# Patient Record
Sex: Male | Born: 1960 | Race: Black or African American | Hispanic: No | Marital: Married | Smoking: Former smoker
Health system: Southern US, Community
[De-identification: ages and names within clinical notes are randomized; demographics above are authoritative.]

---

## 2005-04-27 ENCOUNTER — Ambulatory Visit (HOSPITAL_COMMUNITY): Admission: RE | Admit: 2005-04-27 | Discharge: 2005-04-27 | Payer: Self-pay | Admitting: Family Medicine

## 2009-02-19 ENCOUNTER — Ambulatory Visit (HOSPITAL_COMMUNITY): Admission: RE | Admit: 2009-02-19 | Discharge: 2009-02-19 | Payer: Self-pay | Admitting: Family Medicine

## 2009-12-22 IMAGING — CR DG CHEST 2V
2 series · 2 of 2 positions shown · non-contrast
Comparison: Chest 04/27/2005.

CLINICAL DATA: Smoker.  Cough.

CHEST - 2 VIEW

[view not recorded (1 of 2)]
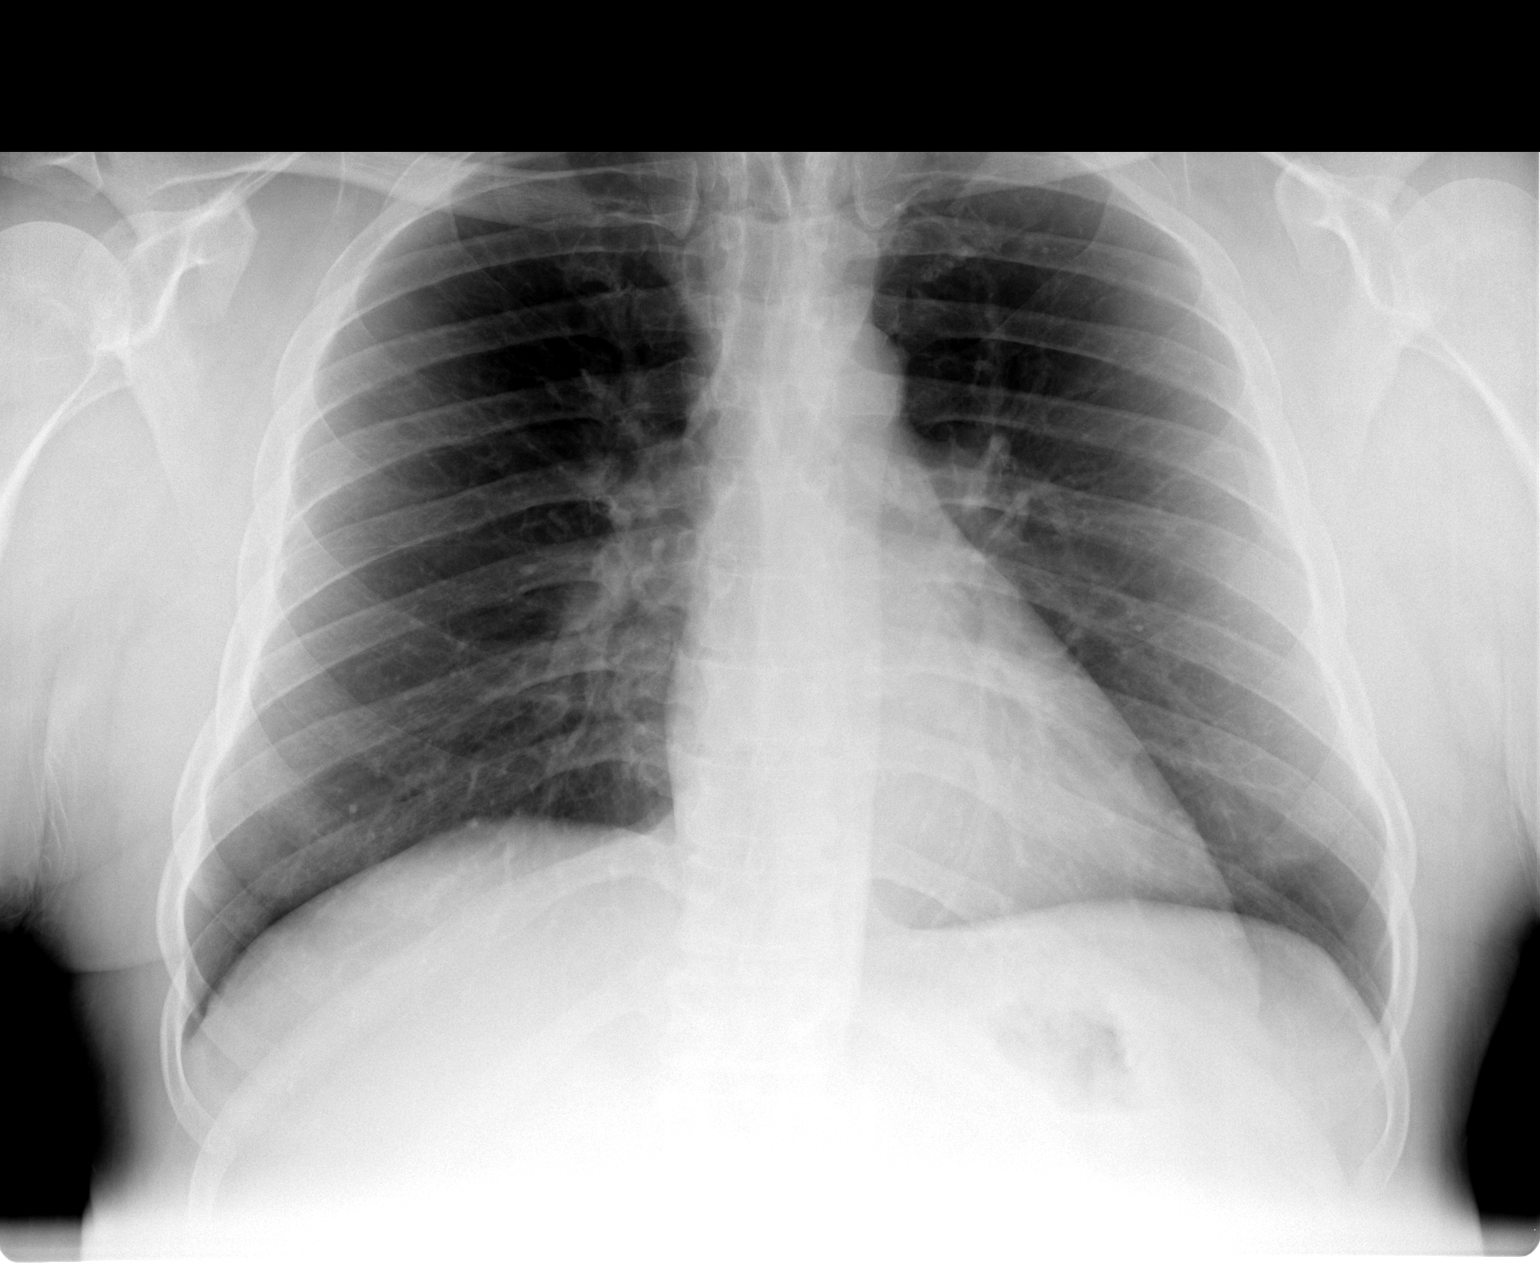

[view not recorded (2 of 2)]
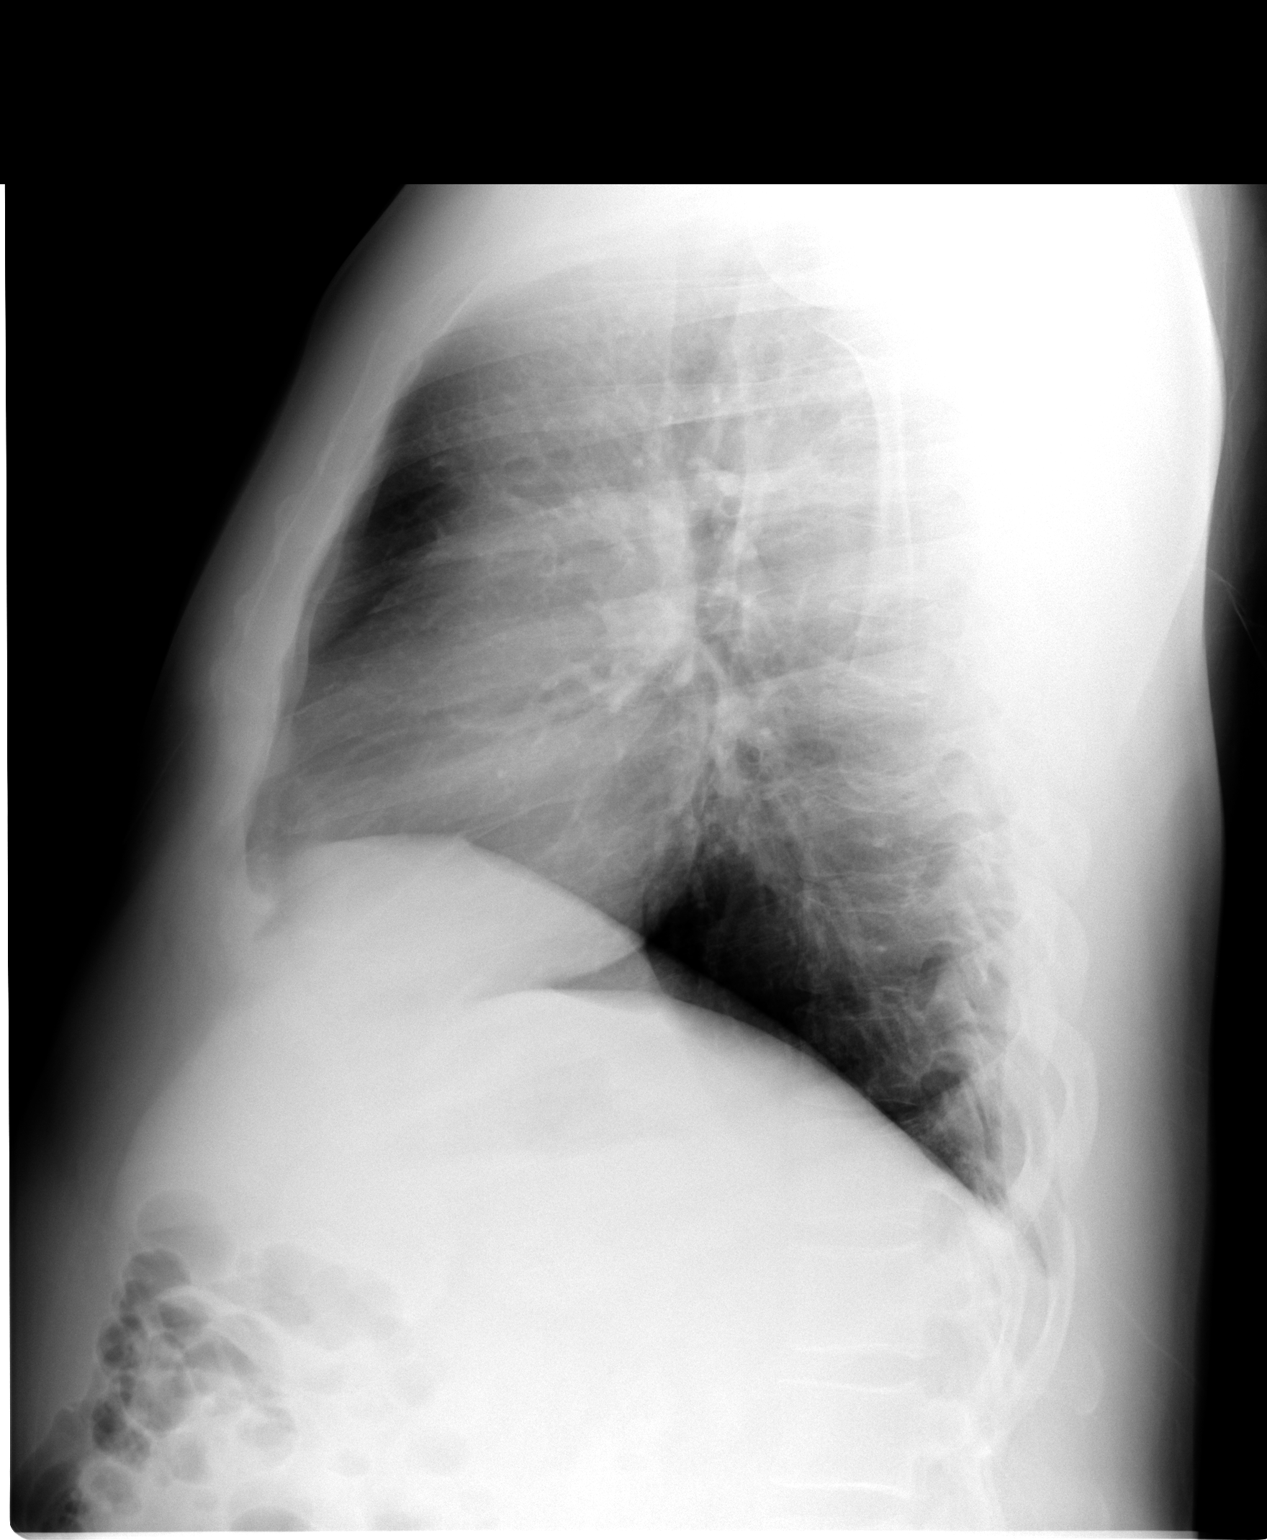

[2 of 2 positions shown; findings below may reference images not displayed]

FINDINGS: The lungs are clear.  Heart size is normal.  There is no
pleural effusion or focal bony abnormality.
IMPRESSION: No acute disease.

## 2019-11-22 ENCOUNTER — Ambulatory Visit: Payer: Self-pay | Attending: Internal Medicine

## 2019-11-22 DIAGNOSIS — Z23 Encounter for immunization: Secondary | ICD-10-CM

## 2019-11-22 NOTE — Progress Notes (Signed)
   Covid-19 Vaccination Clinic  Name:  Daniel Bautista    MRN: [id number] DOB: [birth year]  11/22/2019  [name] was observed post Covid-19 immunization for 15 minutes without incident. He was provided with Vaccine Information Sheet and instruction to access the V-Safe system.   [name] was instructed to call 911 with any severe reactions post vaccine: Marland Kitchen Difficulty breathing  . Swelling of face and throat  . A fast heartbeat  . A bad rash all over body  . Dizziness and weakness   Immunizations Administered    Name Date Dose VIS Date Route   Moderna COVID-19 Vaccine 11/22/2019  8:52 AM 0.5 mL 07/17/2019 Intramuscular   Manufacturer: Moderna   Lot: [id number]   NDC: 64847-207-21

## 2019-12-25 ENCOUNTER — Ambulatory Visit: Payer: Self-pay | Attending: Internal Medicine

## 2019-12-25 ENCOUNTER — Other Ambulatory Visit: Payer: Self-pay

## 2019-12-25 DIAGNOSIS — Z23 Encounter for immunization: Secondary | ICD-10-CM

## 2019-12-25 NOTE — Progress Notes (Signed)
   Covid-19 Vaccination Clinic  Name:  KALANI STHILAIRE    MRN: [id number] DOB: [birth year]  12/25/2019  Mr. Ericsson was observed post Covid-19 immunization for 15 minutes without incident. He was provided with Vaccine Information Sheet and instruction to access the V-Safe system.   Mr. Reuter was instructed to call 911 with any severe reactions post vaccine: Marland Kitchen Difficulty breathing  . Swelling of face and throat  . A fast heartbeat  . A bad rash all over body  . Dizziness and weakness   Immunizations Administered    Name Date Dose VIS Date Route   Moderna COVID-19 Vaccine 12/25/2019  9:19 AM 0.5 mL 07/2019 Intramuscular   Manufacturer: Moderna   Lot: [id number]   NDC: 72158-727-61

## 2019-12-26 ENCOUNTER — Ambulatory Visit: Payer: Self-pay

## 2021-01-23 DIAGNOSIS — R739 Hyperglycemia, unspecified: Secondary | ICD-10-CM | POA: Diagnosis not present

## 2021-01-23 DIAGNOSIS — L0291 Cutaneous abscess, unspecified: Secondary | ICD-10-CM | POA: Diagnosis not present

## 2021-01-23 DIAGNOSIS — R030 Elevated blood-pressure reading, without diagnosis of hypertension: Secondary | ICD-10-CM | POA: Diagnosis not present

## 2022-11-02 ENCOUNTER — Emergency Department (HOSPITAL_COMMUNITY): Payer: BC Managed Care – PPO

## 2022-11-02 ENCOUNTER — Other Ambulatory Visit: Payer: Self-pay

## 2022-11-02 ENCOUNTER — Inpatient Hospital Stay (HOSPITAL_COMMUNITY)
Admission: EM | Admit: 2022-11-02 | Discharge: 2022-11-05 | DRG: 321 | Disposition: A | Discharge status: Home / Self Care | Payer: BC Managed Care – PPO | Attending: Interventional Cardiology | Admitting: Interventional Cardiology

## 2022-11-02 ENCOUNTER — Inpatient Hospital Stay (HOSPITAL_COMMUNITY): Payer: BC Managed Care – PPO

## 2022-11-02 ENCOUNTER — Inpatient Hospital Stay (HOSPITAL_COMMUNITY)
Admission: EM | Disposition: A | Discharge status: Home / Self Care | Payer: Self-pay | Source: Home / Self Care | Attending: Interventional Cardiology

## 2022-11-02 DIAGNOSIS — R0789 Other chest pain: Secondary | ICD-10-CM | POA: Diagnosis not present

## 2022-11-02 DIAGNOSIS — Z8249 Family history of ischemic heart disease and other diseases of the circulatory system: Secondary | ICD-10-CM | POA: Diagnosis not present

## 2022-11-02 DIAGNOSIS — I25110 Atherosclerotic heart disease of native coronary artery with unstable angina pectoris: Secondary | ICD-10-CM | POA: Diagnosis not present

## 2022-11-02 DIAGNOSIS — I213 ST elevation (STEMI) myocardial infarction of unspecified site: Secondary | ICD-10-CM | POA: Diagnosis not present

## 2022-11-02 DIAGNOSIS — Z79899 Other long term (current) drug therapy: Secondary | ICD-10-CM | POA: Diagnosis not present

## 2022-11-02 DIAGNOSIS — I2111 ST elevation (STEMI) myocardial infarction involving right coronary artery: Principal | ICD-10-CM | POA: Diagnosis present

## 2022-11-02 DIAGNOSIS — I5021 Acute systolic (congestive) heart failure: Secondary | ICD-10-CM | POA: Diagnosis present

## 2022-11-02 DIAGNOSIS — Z955 Presence of coronary angioplasty implant and graft: Secondary | ICD-10-CM | POA: Diagnosis not present

## 2022-11-02 DIAGNOSIS — I2119 ST elevation (STEMI) myocardial infarction involving other coronary artery of inferior wall: Secondary | ICD-10-CM | POA: Diagnosis not present

## 2022-11-02 DIAGNOSIS — E118 Type 2 diabetes mellitus with unspecified complications: Secondary | ICD-10-CM | POA: Diagnosis not present

## 2022-11-02 DIAGNOSIS — F17200 Nicotine dependence, unspecified, uncomplicated: Secondary | ICD-10-CM | POA: Diagnosis present

## 2022-11-02 DIAGNOSIS — E871 Hypo-osmolality and hyponatremia: Secondary | ICD-10-CM | POA: Diagnosis present

## 2022-11-02 DIAGNOSIS — E785 Hyperlipidemia, unspecified: Secondary | ICD-10-CM | POA: Diagnosis not present

## 2022-11-02 DIAGNOSIS — F17210 Nicotine dependence, cigarettes, uncomplicated: Secondary | ICD-10-CM | POA: Diagnosis present

## 2022-11-02 DIAGNOSIS — I2109 ST elevation (STEMI) myocardial infarction involving other coronary artery of anterior wall: Secondary | ICD-10-CM | POA: Diagnosis present

## 2022-11-02 DIAGNOSIS — I255 Ischemic cardiomyopathy: Secondary | ICD-10-CM | POA: Diagnosis not present

## 2022-11-02 DIAGNOSIS — D72829 Elevated white blood cell count, unspecified: Secondary | ICD-10-CM | POA: Diagnosis not present

## 2022-11-02 DIAGNOSIS — E1169 Type 2 diabetes mellitus with other specified complication: Secondary | ICD-10-CM | POA: Diagnosis not present

## 2022-11-02 DIAGNOSIS — I2584 Coronary atherosclerosis due to calcified coronary lesion: Secondary | ICD-10-CM | POA: Diagnosis present

## 2022-11-02 DIAGNOSIS — I2510 Atherosclerotic heart disease of native coronary artery without angina pectoris: Secondary | ICD-10-CM

## 2022-11-02 DIAGNOSIS — E1165 Type 2 diabetes mellitus with hyperglycemia: Secondary | ICD-10-CM | POA: Diagnosis not present

## 2022-11-02 DIAGNOSIS — R079 Chest pain, unspecified: Secondary | ICD-10-CM | POA: Diagnosis not present

## 2022-11-02 HISTORY — DX: ST elevation (STEMI) myocardial infarction involving other coronary artery of inferior wall: I21.19

## 2022-11-02 HISTORY — PX: LEFT HEART CATH AND CORONARY ANGIOGRAPHY: CATH118249

## 2022-11-02 HISTORY — PX: CORONARY/GRAFT ACUTE MI REVASCULARIZATION: CATH118305

## 2022-11-02 LAB — CREATININE, SERUM
Creatinine, Ser: 1.08 mg/dL (ref 0.61–1.24)
GFR, Estimated: >60 mL/min (ref >60)

## 2022-11-02 LAB — LIPID PANEL
Cholesterol: 251 mg/dL — ABNORMAL HIGH (ref 0–200)
HDL: 41 mg/dL (ref >40)
LDL Cholesterol: 169 mg/dL — ABNORMAL HIGH (ref 0–99)
Total CHOL/HDL Ratio: 6.1 RATIO
Triglycerides: 205 mg/dL — ABNORMAL HIGH (ref <150)
VLDL: 41 mg/dL — ABNORMAL HIGH (ref 0–40)

## 2022-11-02 LAB — CBC WITH DIFFERENTIAL/PLATELET
Abs Immature Granulocytes: 0.07 10*3/uL (ref 0.00–0.07)
Basophils Absolute: 0.1 10*3/uL (ref 0.0–0.1)
Basophils Relative: 1 %
Eosinophils Absolute: 0.2 10*3/uL (ref 0.0–0.5)
Eosinophils Relative: 2 %
HCT: 50.3 % (ref 39.0–52.0)
Hemoglobin: 17.1 g/dL — ABNORMAL HIGH (ref 13.0–17.0)
Immature Granulocytes: 1 %
Lymphocytes Relative: 33 %
Lymphs Abs: 3.9 10*3/uL (ref 0.7–4.0)
MCH: 28 pg (ref 26.0–34.0)
MCHC: 34 g/dL (ref 30.0–36.0)
MCV: 82.5 fL (ref 80.0–100.0)
Monocytes Absolute: 1 10*3/uL (ref 0.1–1.0)
Monocytes Relative: 9 %
Neutro Abs: 6.5 10*3/uL (ref 1.7–7.7)
Neutrophils Relative %: 54 %
Platelets: 279 10*3/uL (ref 150–400)
RBC: 6.1 MIL/uL — ABNORMAL HIGH (ref 4.22–5.81)
RDW: 12.5 % (ref 11.5–15.5)
WBC: 11.8 10*3/uL — ABNORMAL HIGH (ref 4.0–10.5)
nRBC: 0 % (ref 0.0–0.2)

## 2022-11-02 LAB — CBC
HCT: 49.3 % (ref 39.0–52.0)
Hemoglobin: 17 g/dL (ref 13.0–17.0)
MCH: 28 pg (ref 26.0–34.0)
MCHC: 34.5 g/dL (ref 30.0–36.0)
MCV: 81.1 fL (ref 80.0–100.0)
Platelets: 266 10*3/uL (ref 150–400)
RBC: 6.08 MIL/uL — ABNORMAL HIGH (ref 4.22–5.81)
RDW: 12.4 % (ref 11.5–15.5)
WBC: 15.1 10*3/uL — ABNORMAL HIGH (ref 4.0–10.5)
nRBC: 0 % (ref 0.0–0.2)

## 2022-11-02 LAB — TSH: TSH: 0.729 u[IU]/mL (ref 0.350–4.500)

## 2022-11-02 LAB — COMPREHENSIVE METABOLIC PANEL
ALT: 26 U/L (ref 0–44)
AST: 22 U/L (ref 15–41)
Albumin: 4.1 g/dL (ref 3.5–5.0)
Alkaline Phosphatase: 77 U/L (ref 38–126)
Anion gap: 11 (ref 5–15)
BUN: 14 mg/dL (ref 8–23)
CO2: 24 mmol/L (ref 22–32)
Calcium: 9.2 mg/dL (ref 8.9–10.3)
Chloride: 96 mmol/L — ABNORMAL LOW (ref 98–111)
Creatinine, Ser: 1.14 mg/dL (ref 0.61–1.24)
GFR, Estimated: >60 mL/min (ref >60)
Glucose, Bld: 339 mg/dL — ABNORMAL HIGH (ref 70–99)
Potassium: 3.8 mmol/L (ref 3.5–5.1)
Sodium: 131 mmol/L — ABNORMAL LOW (ref 135–145)
Total Bilirubin: 0.8 mg/dL (ref 0.3–1.2)
Total Protein: 7.6 g/dL (ref 6.5–8.1)

## 2022-11-02 LAB — MAGNESIUM: Magnesium: 2.2 mg/dL (ref 1.7–2.4)

## 2022-11-02 LAB — POCT ACTIVATED CLOTTING TIME
Activated Clotting Time: 228 seconds
Activated Clotting Time: 428 seconds

## 2022-11-02 LAB — PROTIME-INR
INR: 1 (ref 0.8–1.2)
Prothrombin Time: 13.1 seconds (ref 11.4–15.2)

## 2022-11-02 LAB — APTT: aPTT: 22 seconds — ABNORMAL LOW (ref 24–36)

## 2022-11-02 LAB — TROPONIN I (HIGH SENSITIVITY)
Troponin I (High Sensitivity): 4 ng/L (ref <18)
Troponin I (High Sensitivity): 7810 ng/L (ref <18)

## 2022-11-02 LAB — HIV ANTIBODY (ROUTINE TESTING W REFLEX): HIV Screen 4th Generation wRfx: NONREACTIVE

## 2022-11-02 SURGERY — LEFT HEART CATH AND CORONARY ANGIOGRAPHY
Anesthesia: LOCAL

## 2022-11-02 MED ORDER — MIDAZOLAM HCL 2 MG/2ML IJ SOLN
INTRAMUSCULAR | Status: DC | PRN
  Administered 2022-11-02: 1 mg via INTRAVENOUS

## 2022-11-02 MED ORDER — TIROFIBAN HCL IN NACL 5-0.9 MG/100ML-% IV SOLN
0.1500 ug/kg/min | INTRAVENOUS | Status: AC
  Administered 2022-11-02: 0.15 ug/kg/min via INTRAVENOUS
  Filled 2022-11-02: qty 100

## 2022-11-02 MED ORDER — HEPARIN (PORCINE) IN NACL 1000-0.9 UT/500ML-% IV SOLN
INTRAVENOUS | Status: DC | PRN
  Administered 2022-11-02 (×2): 500 mL

## 2022-11-02 MED ORDER — SODIUM CHLORIDE 0.9 % IV SOLN: INTRAVENOUS | Status: DC

## 2022-11-02 MED ORDER — SODIUM CHLORIDE 0.9 % IV SOLN: INTRAVENOUS | Status: AC

## 2022-11-02 MED ORDER — HEPARIN SODIUM (PORCINE) 5000 UNIT/ML IJ SOLN
4000.0000 [IU] | Freq: Once | INTRAMUSCULAR | Status: AC
  Administered 2022-11-02: 4000 [IU] via INTRAVENOUS
  Filled 2022-11-02: qty 1

## 2022-11-02 MED ORDER — TIROFIBAN (AGGRASTAT) BOLUS VIA INFUSION
INTRAVENOUS | Status: DC | PRN
  Administered 2022-11-02: 1950 ug via INTRAVENOUS

## 2022-11-02 MED ORDER — HEPARIN SODIUM (PORCINE) 5000 UNIT/ML IJ SOLN: 5000.0000 [IU] | Freq: Three times a day (TID) | INTRAMUSCULAR | Status: DC

## 2022-11-02 MED ORDER — SODIUM CHLORIDE 0.9 % IV SOLN: 250.0000 mL | INTRAVENOUS | Status: DC | PRN

## 2022-11-02 MED ORDER — SODIUM CHLORIDE 0.9% FLUSH: 3.0000 mL | INTRAVENOUS | Status: DC | PRN

## 2022-11-02 MED ORDER — ATORVASTATIN CALCIUM 80 MG PO TABS
80.0000 mg | ORAL_TABLET | Freq: Every day | ORAL | Status: DC
  Administered 2022-11-02 – 2022-11-05 (×4): 80 mg via ORAL
  Filled 2022-11-02 (×4): qty 1

## 2022-11-02 MED ORDER — FENTANYL CITRATE (PF) 100 MCG/2ML IJ SOLN
INTRAMUSCULAR | Status: DC | PRN
  Administered 2022-11-02: 50 ug via INTRAVENOUS
  Administered 2022-11-02: 25 ug via INTRAVENOUS

## 2022-11-02 MED ORDER — MORPHINE SULFATE (PF) 2 MG/ML IV SOLN: 2.0000 mg | INTRAVENOUS | Status: DC | PRN

## 2022-11-02 MED ORDER — FENTANYL CITRATE PF 50 MCG/ML IJ SOSY
50.0000 ug | PREFILLED_SYRINGE | Freq: Once | INTRAMUSCULAR | Status: AC
  Administered 2022-11-02: 50 ug via INTRAVENOUS
  Filled 2022-11-02: qty 1

## 2022-11-02 MED ORDER — TICAGRELOR 90 MG PO TABS
ORAL_TABLET | ORAL | Status: AC
  Filled 2022-11-02: qty 1

## 2022-11-02 MED ORDER — NITROGLYCERIN 1 MG/10 ML FOR IR/CATH LAB
INTRA_ARTERIAL | Status: DC | PRN
  Administered 2022-11-02: 200 ug via INTRACORONARY

## 2022-11-02 MED ORDER — TIROFIBAN HCL IN NACL 5-0.9 MG/100ML-% IV SOLN
INTRAVENOUS | Status: AC | PRN
  Administered 2022-11-02: .15 ug/kg/min via INTRAVENOUS

## 2022-11-02 MED ORDER — ASPIRIN 81 MG PO CHEW
324.0000 mg | CHEWABLE_TABLET | Freq: Once | ORAL | Status: AC
  Administered 2022-11-02: 324 mg via ORAL
  Filled 2022-11-02: qty 4

## 2022-11-02 MED ORDER — MIDAZOLAM HCL 2 MG/2ML IJ SOLN
INTRAMUSCULAR | Status: AC
  Filled 2022-11-02: qty 2

## 2022-11-02 MED ORDER — VERAPAMIL HCL 2.5 MG/ML IV SOLN
INTRAVENOUS | Status: AC
  Filled 2022-11-02: qty 2

## 2022-11-02 MED ORDER — HEPARIN SODIUM (PORCINE) 1000 UNIT/ML IJ SOLN
INTRAMUSCULAR | Status: AC
  Filled 2022-11-02: qty 10

## 2022-11-02 MED ORDER — HEPARIN SODIUM (PORCINE) 1000 UNIT/ML IJ SOLN
INTRAMUSCULAR | Status: DC | PRN
  Administered 2022-11-02: 4000 [IU] via INTRAVENOUS
  Administered 2022-11-02: 6500 [IU] via INTRAVENOUS

## 2022-11-02 MED ORDER — ONDANSETRON HCL 4 MG/2ML IJ SOLN: 4.0000 mg | Freq: Four times a day (QID) | INTRAMUSCULAR | Status: DC | PRN

## 2022-11-02 MED ORDER — HEPARIN SODIUM (PORCINE) 5000 UNIT/ML IJ SOLN
5000.0000 [IU] | Freq: Three times a day (TID) | INTRAMUSCULAR | Status: DC
  Administered 2022-11-02 – 2022-11-05 (×8): 5000 [IU] via SUBCUTANEOUS
  Filled 2022-11-02 (×8): qty 1

## 2022-11-02 MED ORDER — LIDOCAINE HCL (PF) 1 % IJ SOLN
INTRAMUSCULAR | Status: DC | PRN
  Administered 2022-11-02: 2 mL

## 2022-11-02 MED ORDER — ASPIRIN 81 MG PO CHEW
CHEWABLE_TABLET | ORAL | Status: AC
  Administered 2022-11-02: 324 mg via ORAL
  Filled 2022-11-02: qty 3

## 2022-11-02 MED ORDER — TIROFIBAN HCL IN NACL 5-0.9 MG/100ML-% IV SOLN
INTRAVENOUS | Status: AC
  Filled 2022-11-02: qty 100

## 2022-11-02 MED ORDER — FENTANYL CITRATE (PF) 100 MCG/2ML IJ SOLN
INTRAMUSCULAR | Status: AC
  Filled 2022-11-02: qty 2

## 2022-11-02 MED ORDER — HYDRALAZINE HCL 20 MG/ML IJ SOLN: 10.0000 mg | INTRAMUSCULAR | Status: AC | PRN

## 2022-11-02 MED ORDER — METOPROLOL TARTRATE 25 MG PO TABS
25.0000 mg | ORAL_TABLET | Freq: Two times a day (BID) | ORAL | Status: DC
  Administered 2022-11-02 – 2022-11-05 (×6): 25 mg via ORAL
  Filled 2022-11-02 (×6): qty 1

## 2022-11-02 MED ORDER — TICAGRELOR 90 MG PO TABS
90.0000 mg | ORAL_TABLET | Freq: Two times a day (BID) | ORAL | Status: DC
  Administered 2022-11-03 – 2022-11-05 (×5): 90 mg via ORAL
  Filled 2022-11-02 (×5): qty 1

## 2022-11-02 MED ORDER — NITROGLYCERIN 0.4 MG SL SUBL: 0.4000 mg | SUBLINGUAL_TABLET | SUBLINGUAL | Status: DC | PRN

## 2022-11-02 MED ORDER — LIDOCAINE HCL (PF) 1 % IJ SOLN
INTRAMUSCULAR | Status: AC
  Filled 2022-11-02: qty 30

## 2022-11-02 MED ORDER — VERAPAMIL HCL 2.5 MG/ML IV SOLN
INTRAVENOUS | Status: DC | PRN
  Administered 2022-11-02: 10 mL via INTRA_ARTERIAL

## 2022-11-02 MED ORDER — ASPIRIN 81 MG PO TBEC
81.0000 mg | DELAYED_RELEASE_TABLET | Freq: Every day | ORAL | Status: DC
  Administered 2022-11-03 – 2022-11-05 (×3): 81 mg via ORAL
  Filled 2022-11-02 (×3): qty 1

## 2022-11-02 MED ORDER — TICAGRELOR 90 MG PO TABS
ORAL_TABLET | ORAL | Status: DC | PRN
  Administered 2022-11-02: 180 mg via ORAL

## 2022-11-02 MED ORDER — LABETALOL HCL 5 MG/ML IV SOLN: 10.0000 mg | INTRAVENOUS | Status: AC | PRN

## 2022-11-02 MED ORDER — IOHEXOL 350 MG/ML SOLN
INTRAVENOUS | Status: DC | PRN
  Administered 2022-11-02: 160 mL

## 2022-11-02 MED ORDER — ACETAMINOPHEN 325 MG PO TABS: 650.0000 mg | ORAL_TABLET | ORAL | Status: DC | PRN

## 2022-11-02 MED ORDER — SODIUM CHLORIDE 0.9% FLUSH
3.0000 mL | Freq: Two times a day (BID) | INTRAVENOUS | Status: DC
  Administered 2022-11-02 – 2022-11-05 (×4): 3 mL via INTRAVENOUS

## 2022-11-02 SURGICAL SUPPLY — 27 items
BALL SAPPHIRE NC24 3.0X10 (BALLOONS) ×1
BALLN EMERGE MR 2.5X12 (BALLOONS) ×1
BALLN EMERGE MR 2.5X20 (BALLOONS) ×1
BALLN ~~LOC~~ EMERGE MR 2.75X30 (BALLOONS) ×1
BALLOON EMERGE MR 2.5X12 (BALLOONS) IMPLANT
BALLOON EMERGE MR 2.5X20 (BALLOONS) IMPLANT
BALLOON SAPPHIRE NC24 3.0X10 (BALLOONS) IMPLANT
BALLOON ~~LOC~~ EMERGE MR 2.75X30 (BALLOONS) IMPLANT
CATH OPTITORQUE TIG 4.0 5F (CATHETERS) IMPLANT
CATH VISTA GUIDE 6FR JR4 (CATHETERS) IMPLANT
DEVICE RAD COMP TR BAND LRG (VASCULAR PRODUCTS) IMPLANT
ELECT DEFIB PAD ADLT CADENCE (PAD) IMPLANT
GLIDESHEATH SLEND SS 6F .021 (SHEATH) IMPLANT
GUIDEWIRE INQWIRE 1.5J.035X260 (WIRE) IMPLANT
INQWIRE 1.5J .035X260CM (WIRE) ×1
KIT ENCORE 26 ADVANTAGE (KITS) IMPLANT
KIT HEART LEFT (KITS) ×2 IMPLANT
PACK CARDIAC CATHETERIZATION (CUSTOM PROCEDURE TRAY) ×2 IMPLANT
SHEATH PROBE COVER 6X72 (BAG) IMPLANT
STENT SYNERGY XD 2.50X48 (Permanent Stent) IMPLANT
STENT SYNERGY XD 2.75X12 (Permanent Stent) IMPLANT
SYNERGY XD 2.50X48 (Permanent Stent) ×1 IMPLANT
SYNERGY XD 2.75X12 (Permanent Stent) ×1 IMPLANT
TRANSDUCER W/STOPCOCK (MISCELLANEOUS) ×2 IMPLANT
TUBING CIL FLEX 10 FLL-RA (TUBING) ×2 IMPLANT
WIRE ASAHI PROWATER 180CM (WIRE) IMPLANT
WIRE RUNTHROUGH IZANAI 014 180 (WIRE) IMPLANT

## 2022-11-02 NOTE — Progress Notes (Signed)
MEDICATION RELATED CONSULT NOTE - INITIAL   Pharmacy Consult for Tirofiban Indication: s/p PCI  Not on File  Patient Measurements: Height: 5\' 8"  (172.7 cm) Weight: 78 kg (172 lb) IBW/kg (Calculated) : 68.4  Vital Signs: Temp: 98.2 F (36.8 C) (03/19 1834) Temp Source: Oral (03/19 1834) BP: 124/73 (03/19 1834) Pulse Rate: 0 (03/19 1824) Intake/Output from previous day: No intake/output data recorded. Intake/Output from this shift: No intake/output data recorded.  Labs: Recent Labs    11/02/22 1552  WBC 11.8*  HGB 17.1*  HCT 50.3  PLT 279  APTT 22*  CREATININE 1.14  MG 2.2  ALBUMIN 4.1  PROT 7.6  AST 22  ALT 26  ALKPHOS 77  BILITOT 0.8   Estimated Creatinine Clearance: 65.8 mL/min (by C-G formula based on SCr of 1.14 mg/dL).   Assessment: 62 y.o. presented as code STEMI - now s/p PCI. To continue tirofiban x 6 hours.  Plan:  Tirofiban 0.15 mcg/kg/min x 6 hours  Sherlon Handing, PharmD, BCPS Please see amion for complete clinical pharmacist phone list 11/02/2022,7:05 PM

## 2022-11-02 NOTE — ED Triage Notes (Signed)
This RN, FT RN, and triage NT unable to obtain oral or axillary temp with multiple thermometers.

## 2022-11-02 NOTE — ED Triage Notes (Signed)
Pt ambulatory in triage. NAD noted. Respirations even and unlabored. EKG in process.

## 2022-11-02 NOTE — H&P (Addendum)
Cardiology Admission History and Physical   Patient ID: Daniel Bautista MRN: [id number]; DOB: [birth year]   Admission date: 11/02/2022  PCP:  Merryl Hacker, No   [facility] Cardiologist: New to Dr Ellyn Hack    Chief Complaint:  chest pain   Patient Profile:   Daniel Bautista is a 62 y.o. male with no known PMH presented to [facility] on  11/02/2022 for chest pain.  Transferred to [facility] for emergent cardiac catheterization due to anterior inferior STEMI.  History of Present Illness:   [name] presented to [facility] emergency room at roughly 3:40 PM with complaints of chest pain. He has no known medical history. He states he just finished painting the walls. He started having mid-sternum chest pain around 245pm. He was diaphoretic per observation of his wife. He denied nausea, vomiting, SOB, orthopnea, leg edema.  He had 8/10 pain upon arrival to ER, received ASA, heparin bolus, as well as fentanyl. He has overall improved pain since. He smokes 1/2 PPD, seldom use of ETOH. He reports his father had heart attack in his 53s. He denied hx of HTN, DM, CVA, CKD. He denied any known allergy. He denied any hx of major bleeding or recent bleeding.   EKG in the field showed sinus rhythm 67 bpm, acute ST elevations in anterior as well as inferior leads. Patient is transferred to [facility] for emergent cardiac cath now.  Additional diagnostic lab at ER revealed hyponatremia 131, glucose 339, creatinine 1.14, GFR >60.  High sensitive troponin 4.  LDL 169, triglyceride 205, HDL 41.  CBC was leukocytosis 11 800, hemoglobin elevated 17.1.   Medications Prior to Admission: Prior to Admission medications   Not on File     Allergies:   Not on File  Social History:   Social History   Socioeconomic History   Marital status: Divorced    Spouse name: Not on file   Number of children: Not on file   Years of education: Not on file   Highest education level: Not on file  Occupational  History   Not on file  Tobacco Use   Smoking status: Not on file   Smokeless tobacco: Not on file  Substance and Sexual Activity   Alcohol use: Not on file   Drug use: Not on file   Sexual activity: Not on file  Other Topics Concern   Not on file  Social History Narrative   Not on file   Social Determinants of Health   Financial Resource Strain: Not on file  Food Insecurity: Not on file  Transportation Needs: Not on file  Physical Activity: Not on file  Stress: Not on file  Social Connections: Not on file  Intimate Partner Violence: Not on file    Family History:    Father: MI at 55s   ROS:  Constitutional: Denied fever, chills Eyes: Denied vision loss Ears/Nose/Mouth/Throat: Denied ear ache, sore throat, sinus pain Cardiovascular: see HPI  Respiratory: denied shortness of breath Gastrointestinal: Denied nausea, vomiting, abdominal pain, diarrhea Genital/Urinary: Denied dysuria, hematuria, urinary frequency/urgency Musculoskeletal: Denied muscle ache, joint pain, weakness Skin: Denied rash, wound Neuro: Denied headache, dizziness, syncope Psych: Denied history of depression/anxiety  Endocrine: Denied history of diabetes   Physical Exam/Data:   Vitals:   11/02/22 1529 11/02/22 1607 11/02/22 1613 11/02/22 1615  BP: 105/80  (!) 134/98 (!) 128/97  Pulse: 64 64 81 71  Resp: (!) 21 11 15 12   SpO2: 100% 100% 100% 100%  Weight:  Height:       No intake or output data in the 24 hours ending 11/02/22 1711    11/02/2022    3:27 PM  Last 3 Weights  Weight (lbs) 172 lb  Weight (kg) 78.019 kg     Body mass index is 26.15 kg/m.   Vitals:  Vitals:   11/02/22 1613 11/02/22 1615  BP: (!) 134/98 (!) 128/97  Pulse: 81 71  Resp: 15 12  SpO2: 100% 100%   General Appearance: In no apparent distress, sitting in bed HEENT: Normocephalic, atraumatic. Wears eyeglasses Neck: Supple, trachea midline, no JVDs Cardiovascular: Regular rate and rhythm, normal S1-S2,  no  murmur Respiratory: Resting breathing unlabored, lungs sounds clear to auscultation bilaterally, no use of accessory muscles. On [place] oxygen. No wheezes, rales or rhonchi.   Gastrointestinal: Bowel sounds positive, abdomen soft, non-tender Extremities: Able to move all extremities in bed without difficulty, no edema of BLE Musculoskeletal: Normal muscle bulk and tone Skin: Intact, warm, dry. No rashes or petechiae noted in exposed areas.  Neurologic: Alert, oriented to person, place and time. Fluent speech, no cognitive deficit,  no gross focal neuro deficit Psychiatric: Mild anxiety     EKG:  The ECG that was done showed sinus rhythm, acute ST elevations in anterior as well as inferior leads. EKG showed 6+ millimeters ST elevation in V2, V3 and V4 as well as 3 to 4 mm ST elevations in inferior leads.   Relevant CV Studies:  N/A   Laboratory Data:  High Sensitivity Troponin:   Recent Labs  Lab 11/02/22 1552  TROPONINIHS 4      Chemistry Recent Labs  Lab 11/02/22 1552  NA 131*  K 3.8  CL 96*  CO2 24  GLUCOSE 339*  BUN 14  CREATININE 1.14  CALCIUM 9.2  MG 2.2  GFRNONAA >60  ANIONGAP 11    Recent Labs  Lab 11/02/22 1552  PROT 7.6  ALBUMIN 4.1  AST 22  ALT 26  ALKPHOS 77  BILITOT 0.8   Lipids  Recent Labs  Lab 11/02/22 1552  CHOL 251*  TRIG 205*  HDL 41  LDLCALC 169*  CHOLHDL 6.1   Hematology Recent Labs  Lab 11/02/22 1552  WBC 11.8*  RBC 6.10*  HGB 17.1*  HCT 50.3  MCV 82.5  MCH 28.0  MCHC 34.0  RDW 12.5  PLT 279   Thyroid No results for input(s): "TSH", "FREET4" in the last 168 hours. BNPNo results for input(s): "BNP", "PROBNP" in the last 168 hours.  DDimer No results for input(s): "DDIMER" in the last 168 hours.   Radiology/Studies:  No results found.   Assessment and Plan:   Acute Anterior-Inferior STEMI -Presented with chest pain started at 2:45 PM after painting walls at home - EKG with anterior inferior ST elevation - Hs trop  4 x1 -Transferred to [facility] for emergent cardiac catheterization -Will admit ICU,  check A1C, TSH, obtain Echo, further post-cath order to follow Beta blocker & high dose statin  Hyperglycemia -Will check A1c  Hyponatremia -Sodium 131, will trend BMP  Hyperlipidemia -Nonfasting lipid with LDL 169, triglyceride 205, HDL 41 -Medical therapy pending cath report  Leukocytosis -WBC 11800, trend CBC,  monitor fever, no signs of active infection at current time     Shared Decision Making/Informed Consent  The risks [stroke (1 in 1000), death (1 in 1000), kidney failure [usually temporary] (1 in 500), bleeding (1 in 200), allergic reaction [possibly serious] (1 in 200)], benefits (diagnostic support  and management of coronary artery disease) and alternatives of a cardiac catheterization were discussed in detail with Mr. Moya and he is willing to proceed.    Risk Assessment/Risk Scores:  }  TIMI Risk Score for ST  Elevation MI:   The patient's TIMI risk score is 2, which indicates a 2.2% risk of all cause mortality at 30 days.{    Severity of Illness: The appropriate patient status for this patient is INPATIENT. Inpatient status is judged to be reasonable and necessary in order to provide the required intensity of service to ensure the patient's safety. The patient's presenting symptoms, physical exam findings, and initial radiographic and laboratory data in the context of their chronic comorbidities is felt to place them at high risk for further clinical deterioration. Furthermore, it is not anticipated that the patient will be medically stable for discharge from the hospital within 2 midnights of admission.   * I certify that at the point of admission it is my clinical judgment that the patient will require inpatient hospital care spanning beyond 2 midnights from the point of admission due to high intensity of service, high risk for further deterioration and high frequency of  surveillance required.*   For questions or updates, please contact [place] Please consult [email or web address] for contact info under     Signed, Margie Billet, NP  11/02/2022 5:11 PM    ATTENDING ATTESTATION  I have seen, examined and evaluated the patient this PM in the Cath lab along with Margie Billet, NP.  After reviewing all the available data and chart, we discussed the patients laboratory, study & physical findings as well as symptoms in detail.  I agree with her findings, examination as well as impression recommendations as per our discussion.    Attending adjustments noted in italics.   Recent EKG, difficult televisit anterior or inferior MI.  Still having 6/10 chest pain upon arrival to Cath Lab.  Hemodynamically stable.  Seems comfortable.  Further plans post PCI    Leonie Man, MD, MS Glenetta Hew, M.D., M.S. Interventional Cardiologist  [facility]  Pager # [phone] Phone # [phone] [address] [place], [place] [address]

## 2022-11-02 NOTE — ED Provider Notes (Signed)
[facility] Provider Note   CSN: [id number] Arrival date & time: 11/02/22  1519     History  Chief Complaint  Patient presents with   Chest Pain    Daniel Bautista is a 62 y.o. male.   Chest Pain Associated symptoms: diaphoresis   Patient presents for chest pain.  He has no known medical problems.  He does smoke.  His father had a heart attack in his 80s.  Patient was in his normal state of health earlier today.  Approximate hour prior to arrival, he developed substernal chest pain.  His wife noticed that he was diaphoretic.  Patient denies any radiations of pain.  He denies any associated nausea.  Current pain is 8/10 in severity.  He does not take any daily medications.     Home Medications Prior to Admission medications   Not on File      Allergies    Patient has no allergy information on record.    Review of Systems   Review of Systems  Constitutional:  Positive for diaphoresis.  Cardiovascular:  Positive for chest pain.  All other systems reviewed and are negative.   Physical Exam Updated Vital Signs BP (!) 124/101   Pulse (!) 0   Resp (!) 21   Ht 5\' 8"  (1.727 m)   Wt 78 kg   SpO2 94%   BMI 26.15 kg/m  Physical Exam Vitals and nursing note reviewed.  Constitutional:      General: He is not in acute distress.    Appearance: He is well-developed. He is not ill-appearing, toxic-appearing or diaphoretic.  HENT:     Head: Normocephalic and atraumatic.  Eyes:     Conjunctiva/sclera: Conjunctivae normal.  Cardiovascular:     Rate and Rhythm: Normal rate and regular rhythm.     Heart sounds: No murmur heard. Pulmonary:     Effort: Pulmonary effort is normal. No respiratory distress.     Breath sounds: Normal breath sounds. No decreased breath sounds, wheezing, rhonchi or rales.  Chest:     Chest wall: No tenderness.  Abdominal:     Palpations: Abdomen is soft.     Tenderness: There is no abdominal  tenderness.  Musculoskeletal:        General: No swelling. Normal range of motion.     Cervical back: Neck supple.  Skin:    General: Skin is warm and dry.     Capillary Refill: Capillary refill takes less than 2 seconds.     Coloration: Skin is not cyanotic or pale.  Neurological:     General: No focal deficit present.     Mental Status: He is alert and oriented to person, place, and time.  Psychiatric:        Mood and Affect: Mood normal.        Behavior: Behavior normal.     ED Results / Procedures / Treatments   Labs (all labs ordered are listed, but only abnormal results are displayed) Labs Reviewed  CBC WITH DIFFERENTIAL/PLATELET - Abnormal; Notable for the following components:      Result Value   WBC 11.8 (*)    RBC 6.10 (*)    Hemoglobin 17.1 (*)    All other components within normal limits  APTT - Abnormal; Notable for the following components:   aPTT 22 (*)    All other components within normal limits  COMPREHENSIVE METABOLIC PANEL - Abnormal; Notable for the following components:  Sodium 131 (*)    Chloride 96 (*)    Glucose, Bld 339 (*)    All other components within normal limits  LIPID PANEL - Abnormal; Notable for the following components:   Cholesterol 251 (*)    Triglycerides 205 (*)    VLDL 41 (*)    LDL Cholesterol 169 (*)    All other components within normal limits  PROTIME-INR  MAGNESIUM  HEMOGLOBIN A1C  HIV ANTIBODY (ROUTINE TESTING W REFLEX)  LIPOPROTEIN A (LPA)  CBC  CREATININE, SERUM  TSH  CBC WITH DIFFERENTIAL/PLATELET  BASIC METABOLIC PANEL  POCT ACTIVATED CLOTTING TIME  POCT ACTIVATED CLOTTING TIME  TROPONIN I (HIGH SENSITIVITY)  TROPONIN I (HIGH SENSITIVITY)    EKG None  Radiology No results found.  Procedures Procedures    Medications Ordered in ED Medications  0.9 %  sodium chloride infusion ( Intravenous New Bag/Given 11/02/22 1615)  aspirin EC tablet 81 mg (has no administration in time range)  nitroGLYCERIN  (NITROSTAT) SL tablet 0.4 mg (has no administration in time range)  acetaminophen (TYLENOL) tablet 650 mg (has no administration in time range)  ondansetron (ZOFRAN) injection 4 mg (has no administration in time range)  heparin injection 5,000 Units (has no administration in time range)  ticagrelor (BRILINTA) tablet 90 mg (has no administration in time range)  aspirin chewable tablet 324 mg (324 mg Oral Given 11/02/22 1617)  heparin injection 4,000 Units (4,000 Units Intravenous Given 11/02/22 1614)  fentaNYL (SUBLIMAZE) injection 50 mcg (50 mcg Intravenous Given 11/02/22 1608)  tirofiban (AGGRASTAT) infusion 50 mcg/mL 100 mL (0.15 mcg/kg/min  78 kg Intravenous New Bag/Given 11/02/22 1728)    ED Course/ Medical Decision Making/ A&P                             Medical Decision Making Amount and/or Complexity of Data Reviewed Labs: ordered. Radiology: ordered.  Risk OTC drugs. Prescription drug management.   This patient presents to the ED for concern of chest pain, this involves an extensive number of treatment options, and is a complaint that carries with it a high risk of complications and morbidity.  The differential diagnosis includes ACS, PE, dissection, pneumonia, GERD, PUD, pericarditis, costochondritis   Co morbidities that complicate the patient evaluation  None known   Additional history obtained:  Additional history obtained from patient's wife External records from outside source obtained and reviewed including N/A   Lab Tests:  I Ordered, and personally interpreted labs.  The pertinent results include: Leukocytosis and erythrocytosis are present on CBC.  Remaining lab work pending at time of transfer.   Imaging Studies ordered:  I ordered imaging studies including chest x-ray I independently visualized and interpreted imaging which showed pending at time of transfer I agree with the radiologist interpretation   Cardiac Monitoring: / EKG:  The patient was  maintained on a cardiac monitor.  I personally viewed and interpreted the cardiac monitored which showed an underlying rhythm of: Sinus rhythm   Consultations Obtained:  I requested consultation with the cardiologist, and discussed lab and imaging findings as well as pertinent plan - they recommend: Transfer to [facility]   Problem List / ED Course / Critical interventions / Medication management  Patient is a 62 year old male presenting for acute onset of chest pain.  Onset was 1 hour prior to arrival.  Wife noticed diaphoresis at the time.  Chest pain has been constant since onset.  Is currently 8/10  in severity.  EKG was obtained which does show inferior ST segment elevations.  History and EKG findings are concerning for STEMI.  Heparin and ASA were ordered.  Fentanyl was ordered for analgesia.  Code STEMI was initiated.  I spoke with STEMI doctor on-call who reviewed EKG and advises emergent transfer to [facility] patient was transferred in stable condition. I ordered medication including ASA and heparin for STEMI; fentanyl for analgesia Reevaluation of the patient after these medicines showed that the patient improved I have reviewed the patients home medicines and have made adjustments as needed   Social Determinants of Health:  Does not see doctors  CRITICAL CARE Performed by: Godfrey Pick   Total critical care time: 32 minutes  Critical care time was exclusive of separately billable procedures and treating other patients.  Critical care was necessary to treat or prevent imminent or life-threatening deterioration.  Critical care was time spent personally by me on the following activities: development of treatment plan with patient and/or surrogate as well as nursing, discussions with consultants, evaluation of patient's response to treatment, examination of patient, obtaining history from patient or surrogate, ordering and performing treatments and interventions, ordering and  review of laboratory studies, ordering and review of radiographic studies, pulse oximetry and re-evaluation of patient's condition.         Final Clinical Impression(s) / ED Diagnoses Final diagnoses:  ST elevation myocardial infarction (STEMI), unspecified artery [facility])    Rx / DC Orders ED Discharge Orders          Ordered    AMB Referral to Cardiac Rehabilitation - Phase II        11/02/22 1829              Godfrey Pick, MD 11/02/22 1831

## 2022-11-02 NOTE — ED Notes (Signed)
Pt leaving with EMS  going to [facility] cath lab.  Stable vss.  Family here and aware.

## 2022-11-02 NOTE — Brief Op Note (Signed)
11/02/2022  6:31 PM   PROCEDURE:  Procedure(s): LEFT HEART CATH AND CORONARY ANGIOGRAPHY (N/A) Coronary/Graft Acute MI Revascularization (N/A)  SURGEON:  Surgeon(s) and Role:    * Leonie Man, MD - Primary   PATIENT:  Daniel Bautista  62 y.o. male with no real prior past medical history except for history of smoking and father with CAD in age 46s.  He presented to [facility] emergency room roughly 1 hour after onset of 8 out of 10 chest pain with diaphoresis and nausea.  No notable dyspnea or PND/orthopnea.  No tach Cardia symptoms.  No syncope or near syncope.  On evaluation at a pain he was found to have anterior and inferior ST elevations that were initially read as possible pericarditis, however the nature of his presentation was more consistent with ACS and therefore code STEMI was called he was brought directly to [facility] cardiac catheterization lab for emergent catheterization.  Upon arrival his pain was down to 6 or 7 out of 10 after receiving 50 of fentanyl tenderness prior to arrival.  He received 4000 units of IV heparin and 324 mg aspirin prior to transfer.  EKG showed 6+ millimeters ST elevation in V2, V3 and V4 as well as 3 to 4 mm ST elevations in inferior leads.  PRE-OPERATIVE DIAGNOSIS: Anterior/inferior STEMI  POST-OPERATIVE DIAGNOSIS:   Culprit lesion: Heavily thrombotic ulcerated and calcified proximal to mid 100% occlusion down around the crux after an RV marginal branch.  (After angioplasty and stenting there is a long area of the distal RCA into the PDA which is about 65% stenosed that improved some with nitroglycerin-plan will be to treat this medically and reassess so as not to pass stents through a brand-new stent) Successful DES PCI using Aggrastat and extensive balloon dilation from proximal to near distal RCA.  The extensive lesion was covered using overlapping Synergy DES stents (2.5 mm x 48 mm distal and proximally overlapped with a 2.75 mm x 12 mm stent)  the stented segment was postdilated to 3.1 mm proximally down to 2.8 mm in the mid mid stent with the distal segment being postdilated up to 2.6 mm. TIMI-3 flow reduced to TIMI 0 flow.  Inferior ST elevations resolved on EKG however anterior elevations persist, despite no lesion to explain it. No LAD disease with mid D1 80% (small-moderate caliber vessel-borderline PCI target)-unable to explain anterior ST elevations LCx was into the posterior groove to be codominant with 3 posterolateral branches after giving off OM1 that has a proximal 90% focal hinge lesion.  LPL 1 has a ostial 50% stenosis Relatively preserved LVEF with no obvious wall motion abnormality.  Recommend 2D echo for better evaluation.  LVEDP was 14 mmHg.  PROCEDURE PERFORMED: Time Out: Verified patient identification, verified procedure, site/side was marked, verified correct patient position, special equipment/implants available, medications/allergies/relevent history reviewed, required imaging and test results available. Performed.  Access:  Right Radial Artery: 6 Fr sheath -- Seldinger technique using short Micropuncture needle -- Direct ultrasound guidance used.  Permanent image obtained and placed on chart. -- 10 mL radial cocktail IA; 6400 units IV Heparin  Left Heart Catheterization: 5 &6Fr Catheters advanced or exchanged over a J-wire under direct fluoroscopic guidance into the ascending aorta; TIG 4.0 catheter advanced first.  * LV Hemodynamics (LV Gram): TIG 4.0 catheter * Left Coronary Artery Cineangiography: TIG 4.0 catheter  * Right Coronary Artery Cineangiography: TIG 4.0 catheter ; 6 [place] JR4 guide catheter  Review of initial angiography revealed: 100%  occluded proximal RCA with thrombus  Preparations are made for PCI of culprit lesion in the proximal RCA as there was no LAD lesion found to be potentially culprit -> Additional bolus IV heparin administered to achieve and maintain an ACT greater than 250 sec ->  Aggrastat bolus administered followed by drip to continue for 6 hours post PCI  DES PCI of the RCA performed: Guide catheter 6 French JR4; initial wire Prowater, initial balloon 2.5 mm x 15 mm -> second wire-Run-Through White, second balloon 2.5 mm x 20 mm Initial stent Synergy XD DES 2.5 mm x 48 mm deployed to 2.6 mm Proximal overlapping stent Synergy XD DES 2.75 mm x 12 mm deployed to 3.0 mm Stented segment of the postdilated from 3.1-2 0.8 to 2.6 mm. Initial flow TIMI 0, final flow TIMI-3  --_200 mcg IC NTG given post stent placement  Upon completion of Angiogaphy, the catheter was removed completely out of the body over a wire, without complication.  Radial sheath removed in the Cardiac Catheterization lab with TR Band placed for hemostasis.  TR Band: 1810 Hours; 10 mL air; reverse Barbeau C  MEDICATIONS SQ Lidocaine 4 mL Radial Cocktail: 3 mg Verapmil in 10 mL NS Heparin: Total of 10,500 units IC NTG 200 mcg x 1 Aggrastat bolus and drip Contrast on and 20 mL   ANESTHESIA:   IV sedation -> 2 mg Versed and 75 mg fentanyl  EBL:  < 50 mL  DICTATION: .Dragon Dictation  PLAN OF CARE: Admit to inpatient  -> run Aggrastat for 6 hours.  DAPT for minimum 1 year. Check 2D Echo.   Check FLP, A1c High-dose atorvastatin, metoprolol 25 mg twice daily Would ask ICU colleagues to review the angiographic films for the diagonal and OM branches to see if there amenable to PCI.  In my opinion there may be too small for PCI.  Can be reassessed with outpatient stress test evaluation which would also allow [place] to assess the distal RCA disease.    PATIENT DISPOSITION:  PACU - hemodynamically stable.   Delay start of Pharmacological VTE agent (>24hrs) due to surgical blood loss or risk of bleeding: not applicable   Glenetta Hew, MD

## 2022-11-02 NOTE — ED Triage Notes (Signed)
Pt states he was painting earlier today, was resting and suddenly had a hot flash with chest pain. Denies radiation, denies cardiac history.   Pain 6/10, states it feels like indigestion.

## 2022-11-03 ENCOUNTER — Other Ambulatory Visit (HOSPITAL_COMMUNITY): Payer: Self-pay

## 2022-11-03 ENCOUNTER — Encounter (HOSPITAL_COMMUNITY): Payer: Self-pay | Admitting: Cardiology

## 2022-11-03 ENCOUNTER — Inpatient Hospital Stay (HOSPITAL_COMMUNITY): Payer: BC Managed Care – PPO

## 2022-11-03 DIAGNOSIS — I25110 Atherosclerotic heart disease of native coronary artery with unstable angina pectoris: Secondary | ICD-10-CM

## 2022-11-03 DIAGNOSIS — I2119 ST elevation (STEMI) myocardial infarction involving other coronary artery of inferior wall: Secondary | ICD-10-CM | POA: Diagnosis not present

## 2022-11-03 DIAGNOSIS — E1169 Type 2 diabetes mellitus with other specified complication: Secondary | ICD-10-CM | POA: Diagnosis not present

## 2022-11-03 DIAGNOSIS — E118 Type 2 diabetes mellitus with unspecified complications: Secondary | ICD-10-CM

## 2022-11-03 DIAGNOSIS — F17200 Nicotine dependence, unspecified, uncomplicated: Secondary | ICD-10-CM

## 2022-11-03 DIAGNOSIS — E785 Hyperlipidemia, unspecified: Secondary | ICD-10-CM | POA: Diagnosis not present

## 2022-11-03 DIAGNOSIS — Z955 Presence of coronary angioplasty implant and graft: Secondary | ICD-10-CM | POA: Diagnosis not present

## 2022-11-03 DIAGNOSIS — R079 Chest pain, unspecified: Secondary | ICD-10-CM

## 2022-11-03 DIAGNOSIS — I255 Ischemic cardiomyopathy: Secondary | ICD-10-CM

## 2022-11-03 HISTORY — DX: Type 2 diabetes mellitus with unspecified complications: E11.8

## 2022-11-03 HISTORY — DX: Presence of coronary angioplasty implant and graft: Z95.5

## 2022-11-03 HISTORY — DX: Type 2 diabetes mellitus with other specified complication: E11.69

## 2022-11-03 HISTORY — DX: Atherosclerotic heart disease of native coronary artery with unstable angina pectoris: I25.110

## 2022-11-03 HISTORY — DX: Ischemic cardiomyopathy: I25.5

## 2022-11-03 HISTORY — DX: Nicotine dependence, unspecified, uncomplicated: F17.200

## 2022-11-03 LAB — CBC WITH DIFFERENTIAL/PLATELET
Abs Immature Granulocytes: 0.06 10*3/uL (ref 0.00–0.07)
Basophils Absolute: 0.1 10*3/uL (ref 0.0–0.1)
Basophils Relative: 0 %
Eosinophils Absolute: 0 10*3/uL (ref 0.0–0.5)
Eosinophils Relative: 0 %
HCT: 46.1 % (ref 39.0–52.0)
Hemoglobin: 16.1 g/dL (ref 13.0–17.0)
Immature Granulocytes: 1 %
Lymphocytes Relative: 20 %
Lymphs Abs: 2.5 10*3/uL (ref 0.7–4.0)
MCH: 28 pg (ref 26.0–34.0)
MCHC: 34.9 g/dL (ref 30.0–36.0)
MCV: 80.3 fL (ref 80.0–100.0)
Monocytes Absolute: 1.1 10*3/uL — ABNORMAL HIGH (ref 0.1–1.0)
Monocytes Relative: 8 %
Neutro Abs: 9 10*3/uL — ABNORMAL HIGH (ref 1.7–7.7)
Neutrophils Relative %: 71 %
Platelets: 247 10*3/uL (ref 150–400)
RBC: 5.74 MIL/uL (ref 4.22–5.81)
RDW: 12.4 % (ref 11.5–15.5)
WBC: 12.6 10*3/uL — ABNORMAL HIGH (ref 4.0–10.5)
nRBC: 0 % (ref 0.0–0.2)

## 2022-11-03 LAB — BASIC METABOLIC PANEL
Anion gap: 8 (ref 5–15)
BUN: 14 mg/dL (ref 8–23)
CO2: 22 mmol/L (ref 22–32)
Calcium: 9 mg/dL (ref 8.9–10.3)
Chloride: 101 mmol/L (ref 98–111)
Creatinine, Ser: 1.03 mg/dL (ref 0.61–1.24)
GFR, Estimated: >60 mL/min (ref >60)
Glucose, Bld: 285 mg/dL — ABNORMAL HIGH (ref 70–99)
Potassium: 4.5 mmol/L (ref 3.5–5.1)
Sodium: 131 mmol/L — ABNORMAL LOW (ref 135–145)

## 2022-11-03 LAB — ECHOCARDIOGRAM COMPLETE
AR max vel: 2.39 cm2
AV Area VTI: 2.35 cm2
AV Area mean vel: 2.16 cm2
AV Mean grad: 3 mmHg
AV Peak grad: 5 mmHg
Ao pk vel: 1.12 m/s
Area-P 1/2: 3.89 cm2
Calc EF: 45.8 %
Height: 68 in
S' Lateral: 2.5 cm
Single Plane A2C EF: 52.5 %
Single Plane A4C EF: 37 %
Weight: 2814.4 oz

## 2022-11-03 LAB — HEMOGLOBIN A1C
Hgb A1c MFr Bld: 11.7 % — ABNORMAL HIGH (ref 4.8–5.6)
Mean Plasma Glucose: 289 mg/dL

## 2022-11-03 LAB — LIPID PANEL
Cholesterol: 216 mg/dL — ABNORMAL HIGH (ref 0–200)
HDL: 39 mg/dL — ABNORMAL LOW (ref >40)
LDL Cholesterol: 154 mg/dL — ABNORMAL HIGH (ref 0–99)
Total CHOL/HDL Ratio: 5.5 RATIO
Triglycerides: 116 mg/dL (ref <150)
VLDL: 23 mg/dL (ref 0–40)

## 2022-11-03 LAB — GLUCOSE, CAPILLARY
Glucose-Capillary: 355 mg/dL — ABNORMAL HIGH (ref 70–99)
Glucose-Capillary: 116 mg/dL — ABNORMAL HIGH (ref 70–99)
Glucose-Capillary: 184 mg/dL — ABNORMAL HIGH (ref 70–99)
Glucose-Capillary: 299 mg/dL — ABNORMAL HIGH (ref 70–99)

## 2022-11-03 MED ORDER — INSULIN ASPART 100 UNIT/ML IJ SOLN
0.0000 [IU] | Freq: Every day | INTRAMUSCULAR | Status: DC
  Administered 2022-11-03: 3 [IU] via SUBCUTANEOUS

## 2022-11-03 MED ORDER — PERFLUTREN LIPID MICROSPHERE
1.0000 mL | INTRAVENOUS | Status: AC | PRN
  Administered 2022-11-03: 2 mL via INTRAVENOUS

## 2022-11-03 MED ORDER — INSULIN ASPART 100 UNIT/ML IJ SOLN
0.0000 [IU] | Freq: Three times a day (TID) | INTRAMUSCULAR | Status: DC
  Administered 2022-11-03: 3 [IU] via SUBCUTANEOUS
  Administered 2022-11-03: 15 [IU] via SUBCUTANEOUS
  Administered 2022-11-04: 2 [IU] via SUBCUTANEOUS
  Administered 2022-11-04: 3 [IU] via SUBCUTANEOUS
  Administered 2022-11-04 – 2022-11-05 (×2): 15 [IU] via SUBCUTANEOUS

## 2022-11-03 MED ORDER — CHLORHEXIDINE GLUCONATE CLOTH 2 % EX PADS
6.0000 | MEDICATED_PAD | Freq: Every day | CUTANEOUS | Status: DC
  Administered 2022-11-03: 6 via TOPICAL

## 2022-11-03 MED FILL — Heparin Sodium (Porcine) Inj 1000 Unit/ML: INTRAMUSCULAR | Qty: 10 | Status: AC

## 2022-11-03 NOTE — Discharge Instructions (Signed)

## 2022-11-03 NOTE — Progress Notes (Addendum)
CARDIOLOGY ROUNDING PROGRESS NOTE  Patient Name: Daniel Bautista Date of Encounter: 11/03/2022  [facility] Cardiologist: None   Patient Profile     62 y.o. male with hx of smoking, Fhx of paternal CAD in 80s transferred to [facility] from [facility] for emergent cardiac catheterization in the setting of anterior and inferior STEMI. Patient is now s/p PCXI with x2 DES to RCA with Dr. Ellyn Hack    Principal Problem:   STEMI (ST elevation myocardial infarction) [facility]) Active Problems:   Acute ST elevation myocardial infarction (STEMI) of inferior wall (HCC)   Subjective    O/N: Reports improvement in chest pain post procedure. Denies shortness of breath at rest or with ambulation around room and to restroom. Denies bleeding from [facility] insertion site. Denies catabolic symptoms form hyperglycemia in the past and currently.   Patient states that last PCP follow up was ~8 years ago and lost follow up 2/2 losing insurance coverage. At the time, he had been diagnosed with diabetes and was taking medications for it. He continues to smoke a pack per day but has decided to quit given the recent MI and state of cardiovascular disease. Patient was encouraged.   Of note, his father had an MI at age 1; he is still alive at age 46.   ROS:  All other ROS reviewed and negative. Pertinent positives noted in the HPI.     Inpatient Medications  Scheduled Meds:  aspirin EC  81 mg Oral Daily   atorvastatin  80 mg Oral Daily   Chlorhexidine Gluconate Cloth  6 each Topical Daily   heparin  5,000 Units Subcutaneous Q8H   metoprolol tartrate  25 mg Oral BID   sodium chloride flush  3 mL Intravenous Q12H   ticagrelor  90 mg Oral BID   Continuous Infusions:  sodium chloride Stopped (11/03/22 0001)   sodium chloride     PRN Meds: sodium chloride, acetaminophen, morphine injection, nitroGLYCERIN, ondansetron (ZOFRAN) IV, sodium chloride flush   Vital Signs    Vitals:   11/03/22 0645 11/03/22 0650  11/03/22 0655 11/03/22 0720  BP:    125/77  Pulse: 64 (!) 58 69 64  Resp:      Temp:      TempSrc:      SpO2: 96% 100% 98% 98%  Weight:      Height:        Intake/Output Summary (Last 24 hours) at 11/03/2022 0754 Last data filed at 11/03/2022 0551 Gross per 24 hour  Intake 1392.77 ml  Output 551 ml  Net 841.77 ml      11/03/2022    5:00 AM 11/02/2022    3:27 PM  Last 3 Weights  Weight (lbs) 175 lb 14.4 oz 172 lb  Weight (kg) 79.788 kg 78.019 kg      Telemetry  Overnight telemetry shows ST segment elevation in the inferior leads, which I personally reviewed.    Physical Exam   General: Well nourished, well developed, in no acute distress Head: Atraumatic, normal size  Eyes: PEERLA, EOMI  Neck: Supple, no JVD Endocrine: No thryomegaly Cardiac: Normal S1, S2; RRR; no murmurs, rubs, or gallops Lungs: Clear to auscultation bilaterally, no wheezing, rhonchi or rales  Abd: Soft, nontender, no hepatomegaly  Ext: No edema, Radial and PD pulses 2+. R radial LHC access site with bandage without drainage or signs of ecchymoses or edema. Normal Allen's and reverse Barbeau's test.  Musculoskeletal: No deformities, BUE and BLE strength normal and equal Skin:  Warm and dry, no rashes   Neuro: Alert and oriented to person, place, time, and situation; moving all extremities without overt focal neurological deficit Psych: Normal mood and affect   ECG  The most recent ECG shows ST segment elevation in the inferior and anterior leads, which I personally reviewed.   Labs    High Sensitivity Troponin:   Recent Labs  Lab 11/02/22 1552 11/02/22 1943  TROPONINIHS 4 7,810*     Chemistry Recent Labs  Lab 11/02/22 1552 11/02/22 1943 11/03/22 0028  NA 131*  --  131*  K 3.8  --  4.5  CL 96*  --  101  CO2 24  --  22  GLUCOSE 339*  --  285*  BUN 14  --  14  CREATININE 1.14 1.08 1.03  CALCIUM 9.2  --  9.0  MG 2.2  --   --   PROT 7.6  --   --   ALBUMIN 4.1  --   --   AST 22  --    --   ALT 26  --   --   ALKPHOS 77  --   --   BILITOT 0.8  --   --   GFRNONAA >60 >60 >60  ANIONGAP 11  --  8    Lipids  Recent Labs  Lab 11/03/22 0028  CHOL 216*  TRIG 116  HDL 39*  LDLCALC 154*  CHOLHDL 5.5    Hematology Recent Labs  Lab 11/02/22 1552 11/02/22 1943 11/03/22 0028  WBC 11.8* 15.1* 12.6*  RBC 6.10* 6.08* 5.74  HGB 17.1* 17.0 16.1  HCT 50.3 49.3 46.1  MCV 82.5 81.1 80.3  MCH 28.0 28.0 28.0  MCHC 34.0 34.5 34.9  RDW 12.5 12.4 12.4  PLT 279 266 247   Thyroid  Recent Labs  Lab 11/02/22 1943  TSH 0.729    BNPNo results for input(s): "BNP", "PROBNP" in the last 168 hours.  DDimer No results for input(s): "DDIMER" in the last 168 hours.   Radiology    CARDIAC CATHETERIZATION  Result Date: 11/02/2022   Lesion 1a: Prox RCA lesion is 55% stenosed.  Lestion 1b (CULPIT): Prox RCA to Dist RCA lesion is 100% stenosed with 100% stenosed side branch in Acute Mrg.   A drug-eluting stent was successfully placed covering the majority of the Culprit Lesion using a SYNERGY XD 2.50X48 -> deployed to 2.6 mm with the middle 1/3 postdilated to 2.8 mm, and proximal 1/3 postdilated to 3.1 mm.   A drug-eluting stent was successfully placed covering the proximal end of the initial stent and Lesion 1 a using a SYNERGY XD 2.75X12.  Postdilated to 3.1 mm.   Balloon angioplasty was performed on the acute marginal side branch using a BALLN EMERGE MR 2.5X12.   Post intervention, there is a 0% residual stenosis throughout the stented segment..   Post intervention, the Acute Margina side branch was reduced to 0% residual stenosis.   ------ Dist RCA lesion is 65% stenosed with 60% stenosed side branch in RPDA.   1st Diag lesion is 80% stenosed. .   1st Mrg lesion is 90% stenosed   --------------------------------------------------------   The left ventricular systolic function is normal.   LV end diastolic pressure is normal.   The left ventricular ejection fraction is 50-55% by visual  estimate.   There is no aortic valve stenosis. POST-OPERATIVE DIAGNOSIS:  Culprit lesion: Heavily thrombotic ulcerated and calcified proximal to mid 100% occlusion down around the crux after an RV  marginal branch.  (After angioplasty and stenting there is a long area of the distal RCA into the PDA which is about 65% stenosed that improved some with nitroglycerin-plan will be to treat this medically and reassess so as not to pass stents through a brand-new stent) Successful DES PCI using Aggrastat and extensive balloon dilation from proximal to near distal RCA.  The extensive lesion was covered using overlapping Synergy DES stents (2.5 mm x 48 mm distal and proximally overlapped with a 2.75 mm x 12 mm stent) the stented segment was postdilated to 3.1 mm proximally down to 2.8 mm in the mid mid stent with the distal segment being postdilated up to 2.6 mm. TIMI-3 flow reduced to TIMI 0 flow.  Inferior ST elevations resolved on EKG however anterior elevations persist, despite no lesion to explain it. No LAD disease with mid D1 80% (small-moderate caliber vessel-borderline PCI target)-unable to explain anterior ST elevations LCx was into the posterior groove to be codominant with 3 posterolateral branches after giving off OM1 that has a proximal 90% focal hinge lesion.  LPL 1 has a ostial 50% stenosis Relatively preserved LVEF with no obvious wall motion abnormality.  Recommend 2D echo for better evaluation.  LVEDP was 14 mmHg. PLAN OF CARE: Admit to inpatient  -> run Aggrastat for 6 hours.  DAPT for minimum 1 year. Check 2D Echo.  Check FLP, A1c High-dose atorvastatin, metoprolol 25 mg twice daily Would ask ICU colleagues to review the angiographic films for the diagonal and OM branches to see if there amenable to PCI.  In my opinion there may be too small for PCI.  Can be reassessed with outpatient stress test evaluation which would also allow [place] to assess the distal RCA disease. Smokes cessation counseling.  Cardiac  Rehab Phase 1 and Phase 2 Consult Placed. Glenetta Hew, MD  DG Chest Port 1 View  Result Date: 11/02/2022 CLINICAL DATA:  Chest pain. EXAM: PORTABLE CHEST 1 VIEW COMPARISON:  Chest radiograph dated 02/19/2009. FINDINGS: The heart size and mediastinal contours are within normal limits. Both lungs are clear. The visualized skeletal structures are unremarkable. IMPRESSION: No active disease. Electronically Signed   By: Anner Crete M.D.   On: 11/02/2022 18:48    Cardiac Studies - Summarized  04/04/2023  Culprit lesion: Heavily thrombotic ulcerated and calcified proximal to mid 100% occlusion down around the crux after an RV marginal branch.  (After angioplasty and stenting there is a long area of the distal RCA into the PDA which is about 65% stenosed that improved some with nitroglycerin plan will be to treat this medically and reassess so as not to pass stents through a brand-new stent) Successful DES PCI using Aggrastat and extensive balloon dilation from proximal to near distal RCA.  The extensive lesion was covered using overlapping Synergy DES stents (2.5 mm x 48 mm distal and proximally overlapped with a 2.75 mm x 12 mm stent) the stented segment was postdilated to 3.1 mm proximally down to 2.8 mm in the mid mid stent with the distal segment being postdilated up to 2.6 mm. TIMI-3 flow reduced to TIMI 0 flow.  Inferior ST elevations resolved on EKG however anterior elevations persist, despite no lesion to explain it. No LAD disease with mid D1 80% (small-moderate caliber vessel-borderline PCI target) Unable to explain anterior ST elevations LCx was into the posterior groove to be codominant with 3 posterolateral branches after giving off OM1 that has a proximal 90% focal hinge lesion.  LPL 1 has  a ostial 50% stenosis Relatively preserved LVEF with no obvious wall motion abnormality.  Recommend 2D echo for better evaluation.  LVEDP was 14 mmHg.      Assessment & Plan   Anterior -  inferior STEMI Multivessel CAD S/p PCI with x2 DES to RCA Troponin from 4- >7K.Transferred to [facility] for emergent catheterization. Now s/p PCI with 2x DES to RCA. There is residual CAD in first obtuse marginal artery with 90% stenosis and D1 diagonal branch off LAD with 80% stenosis, with plans to manage with pharmacotherapy. No other PCIs scheduled for this hospitalization. Euvolemic on exam today. Will follow up with 2D echocardiogram to assess LV function in setting of potential ischemic cardiomyopathy.  Will continue medical management as it follows: -Brillinta 90 mg BID and ASA 81 mg daily for 12 months -Continue on metoprolol tartrate 25 mg BID -Lipitor 80 mg daily -Nitroglycerine PRN -Follow up 2D echocardiogram today -Cardiac rehab -Patient is stable for transfer out of the ICU on cardiac telemetry floor  HLD LDL 154 -Started atorvastatin 80 mg daily  Leukocytosis Likely reactive; downstrending today. No symptoms or signs of infection on exam -Will continue to monitor  Hyponatremia Corrected Na for hyperglycemia 135 -Continue to monitor   Hyperglycemia A1c pending Persistently hyperglycemic  Patient lost to follow up 8 years ago 2/2 lack of insurance coverage, now insured Will start on SSI for the next 24 hrs to assess insulin needs -SSI  Discussed current plan with attending, Dr. Irish Lack, during rounds.     Romana Juniper, MD [facility] Internal Medicine Program 11/03/2022, 7:54 AM  I have examined the patient and reviewed assessment and plan and discussed with patient.  Agree with above as stated.    I personally reviewed the cath films.  He has residual disease and branch vessels including the PDA, OM and diagonal.  They are all relatively small vessels.  He was not having angina prior to this acute event.  For now, would favor medical therapy.  Given how long the PDA is, if there was a branch vessel to treat first I would prioritize the PDA, then the OM and  the diagonal last.  Plan to move to telemetry today.  Continue dual antiplatelet therapy, lipid-lowering therapy.  A1c pending.  Currently getting sliding scale insulin.  Plan for ACE inhibitor And beta-blocker as blood pressure allows.  Larae Grooms   For questions or updates, please contact [place] Please consult [email or web address] for contact info under

## 2022-11-03 NOTE — Progress Notes (Signed)
CARDIAC REHAB PHASE I   PRE:  Rate/Rhythm: 60 SR  BP:  Sitting: 113/76      SaO2: 97 RA  MODE:  Ambulation: 370 ft   POST:  Rate/Rhythm: 75 SR  BP:  Sitting: 129/82      SaO2: 98 RA  Pt ambulated independently in hall, tolerated well with no CP, dizziness or SOB. Returned to chair with call bell and bedside table in reach. Post MI/stent education including site care, restrictions, risk factors, heart healthy diabetic diet, MI booklet, exercise guidelines, antiplatelet therapy importance, smoking cessation and CRP2 reviewed. All questions and concerns addressed. Will refer to AP for CRP2.  Will continue to follow.   [id number] Vanessa Barbara, RN BSN 11/03/2022 12:16 PM

## 2022-11-03 NOTE — Inpatient Diabetes Management (Signed)
Inpatient Diabetes Program Recommendations  AACE/ADA: New Consensus Statement on Inpatient Glycemic Control (2015)  Target Ranges:  Prepandial:   less than 140 mg/dL      Peak postprandial:   less than 180 mg/dL (1-2 hours)      Critically ill patients:  140 - 180 mg/dL   Lab Results  Component Value Date   GLUCAP 355 (H) 11/03/2022    Review of Glycemic Control  Latest Reference Range & Units 11/03/22 09:35  Glucose-Capillary 70 - 99 mg/dL 355 (H)  (H): Data is abnormally high Diabetes history: No DM hx, new onset ? Outpatient Diabetes medications: none Current orders for Inpatient glycemic control: Novolog 0-15 units TID & HS  A1C pending  Inpatient Diabetes Program Recommendations:    Also, consider adding Semglee 12 units QD.   Thanks, Bronson Curb, MSN, RNC-OB Diabetes Coordinator [phone] (8a-5p)

## 2022-11-03 NOTE — Care Management (Signed)
  Transition of Care [facility]) Screening Note   Patient Details  Name: Daniel Bautista Date of Birth: August 04, 1961   Transition of Care [facility]) CM/SW Contact:    Bethena Roys, RN Phone Number: 11/03/2022, 3:29 PM    Transition of Care Department [facility]) has reviewed the patient and no TOC needs have been identified at this time. Patient presented with chest pain-post LHC. Benefits check submitted for Brilinta. TOC will continue to monitor patient advancement through interdisciplinary progression rounds. If new patient transition needs arise, please place a TOC consult.

## 2022-11-03 NOTE — TOC Benefit Eligibility Note (Signed)
Patient Teacher, English as a foreign language completed.    The patient is currently admitted and upon discharge could be taking Brilinta 90 mg.  The current 30 day co-pay is $190.00 due to a $150.00 deductible. Will be $40.00 once deductible is met.   The patient is insured through [employer] [place], [place] Patient Advocate Specialist [place] Patient Advocate Team Direct Number: [phone]  Fax: [phone]

## 2022-11-04 ENCOUNTER — Encounter (HOSPITAL_COMMUNITY): Payer: Self-pay | Admitting: Cardiology

## 2022-11-04 ENCOUNTER — Other Ambulatory Visit (HOSPITAL_COMMUNITY): Payer: Self-pay

## 2022-11-04 DIAGNOSIS — E1169 Type 2 diabetes mellitus with other specified complication: Secondary | ICD-10-CM | POA: Diagnosis not present

## 2022-11-04 DIAGNOSIS — Z955 Presence of coronary angioplasty implant and graft: Secondary | ICD-10-CM | POA: Diagnosis not present

## 2022-11-04 DIAGNOSIS — E118 Type 2 diabetes mellitus with unspecified complications: Secondary | ICD-10-CM | POA: Diagnosis not present

## 2022-11-04 DIAGNOSIS — I2119 ST elevation (STEMI) myocardial infarction involving other coronary artery of inferior wall: Secondary | ICD-10-CM | POA: Diagnosis not present

## 2022-11-04 LAB — BASIC METABOLIC PANEL
Anion gap: 10 (ref 5–15)
BUN: 15 mg/dL (ref 8–23)
CO2: 25 mmol/L (ref 22–32)
Calcium: 9.5 mg/dL (ref 8.9–10.3)
Chloride: 100 mmol/L (ref 98–111)
Creatinine, Ser: 0.96 mg/dL (ref 0.61–1.24)
GFR, Estimated: >60 mL/min (ref >60)
Glucose, Bld: 182 mg/dL — ABNORMAL HIGH (ref 70–99)
Potassium: 3.8 mmol/L (ref 3.5–5.1)
Sodium: 135 mmol/L (ref 135–145)

## 2022-11-04 LAB — GLUCOSE, CAPILLARY
Glucose-Capillary: 437 mg/dL — ABNORMAL HIGH (ref 70–99)
Glucose-Capillary: 129 mg/dL — ABNORMAL HIGH (ref 70–99)
Glucose-Capillary: 158 mg/dL — ABNORMAL HIGH (ref 70–99)
Glucose-Capillary: 179 mg/dL — ABNORMAL HIGH (ref 70–99)

## 2022-11-04 LAB — CBC
HCT: 45 % (ref 39.0–52.0)
Hemoglobin: 15.8 g/dL (ref 13.0–17.0)
MCH: 28 pg (ref 26.0–34.0)
MCHC: 35.1 g/dL (ref 30.0–36.0)
MCV: 79.8 fL — ABNORMAL LOW (ref 80.0–100.0)
Platelets: 224 10*3/uL (ref 150–400)
RBC: 5.64 MIL/uL (ref 4.22–5.81)
RDW: 12.6 % (ref 11.5–15.5)
WBC: 11.8 10*3/uL — ABNORMAL HIGH (ref 4.0–10.5)
nRBC: 0 % (ref 0.0–0.2)

## 2022-11-04 LAB — HEMOGLOBIN A1C
Hgb A1c MFr Bld: 12.2 % — ABNORMAL HIGH (ref 4.8–5.6)
Mean Plasma Glucose: 303 mg/dL

## 2022-11-04 LAB — LIPOPROTEIN A (LPA): Lipoprotein (a): 213.3 nmol/L — ABNORMAL HIGH (ref <75.0)

## 2022-11-04 MED ORDER — INSULIN GLARGINE-YFGN 100 UNIT/ML ~~LOC~~ SOLN
12.0000 [IU] | Freq: Every day | SUBCUTANEOUS | Status: DC
  Administered 2022-11-04 – 2022-11-05 (×2): 12 [IU] via SUBCUTANEOUS
  Filled 2022-11-04 (×2): qty 0.12

## 2022-11-04 MED ORDER — DAPAGLIFLOZIN PROPANEDIOL 10 MG PO TABS
10.0000 mg | ORAL_TABLET | Freq: Every day | ORAL | Status: DC
  Administered 2022-11-04 – 2022-11-05 (×2): 10 mg via ORAL
  Filled 2022-11-04 (×2): qty 1

## 2022-11-04 MED ORDER — LIVING WELL WITH DIABETES BOOK
Freq: Once | Status: AC
  Filled 2022-11-04: qty 1

## 2022-11-04 MED ORDER — SPIRONOLACTONE 12.5 MG HALF TABLET
12.5000 mg | ORAL_TABLET | Freq: Every day | ORAL | Status: DC
  Administered 2022-11-04 – 2022-11-05 (×2): 12.5 mg via ORAL
  Filled 2022-11-04 (×2): qty 1

## 2022-11-04 MED ORDER — INSULIN STARTER KIT- PEN NEEDLES (ENGLISH)
1.0000 | Freq: Once | Status: AC
  Administered 2022-11-04: 1
  Filled 2022-11-04: qty 1

## 2022-11-04 NOTE — Progress Notes (Signed)
Heart Failure Nurse Navigator Progress Note  PCP: Pcp, No PCP-Cardiologist: None Admission Diagnosis: STEMI Admitted from: [facility]  Presentation:   Daniel Bautista presented with chest pain after painting at his home, BP 124/101, HR 90, Troponin 7,810, EKG with inferior ST elevations, Transferred from [facility] to [facility] for heart cath, showed complete occlusion of RCA s/p PCI with DES x 2.   Patient was educated on the sign and symptoms of heart failure, daily weights, when to call his doctor or go to the ED, Diet/ fluid restrictions and taking all medications as prescribed, attending all medical appointments. Patient verbalized his understanding of education a HF TOC appointment was scheduled for 11/22/2022 @ 10 am.   ECHO/ LVEF: 35-40% G2DD  Clinical Course:  Past Medical History:  Diagnosis Date   Acute ST elevation myocardial infarction (STEMI) of inferior wall ([place]) 11/02/2022   Coronary artery disease involving native coronary artery of native heart with unstable angina pectoris ([place]) 11/03/2022   04/04/2023     Culprit lesion: Heavily thrombotic ulcerated /calcified prox-mid RCA 100% occlusion around crux after an RVM branch.  (After angioplasty and stenting there is a long area of the distal RCA into the PDA which is about 65% stenosed => improved some w/ NTG IC, so plan MedRx; OM1 90%, D1 80% (Med Rx).   Current smoker 11/03/2022   Hyperlipidemia associated with type 2 diabetes mellitus ([place]) 11/03/2022   Ischemic cardiomyopathy 11/03/2022   TTE 11/03/2022-s/p inferior STEMI: EF 35 to 40%.  Moderate reduced function.  Global HK.  GR 2 DD.  D-shaped IVS consistent with RV volume overload.  RV function mildly reduced, mildly enlarged.   Presence of drug coated stent in right coronary artery 11/03/2022   Overlapping Synergr DES 2.5 x 48 & 2.75 x 12 - tapered post-dilation 3.1-2.8-2.6 mm (prox-~distal RCA) in setting of Inf STEMI -100% prox RCA   Type 2 diabetes mellitus with complication,  without long-term current use of insulin ([place]) 11/03/2022     Social History   Socioeconomic History   Marital status: Married    Spouse name: Levada Dy   Number of children: 3   Years of education: Not on file   Highest education level: High school graduate  Occupational History   Occupation: Retired  Tobacco Use   Smoking status: Former    Years: 8    Types: Cigarettes    Quit date: 11/02/2022   Smokeless tobacco: Not on file  Vaping Use   Vaping Use: Never used  Substance and Sexual Activity   Alcohol use: Yes    Alcohol/week: 12.0 standard drinks of alcohol    Types: 12 Cans of beer per week    Comment: 12 pack per month   Drug use: Never   Sexual activity: Not on file  Other Topics Concern   Not on file  Social History Narrative   Not on file   Social Determinants of Health   Financial Resource Strain: Low Risk  (11/04/2022)   Overall Financial Resource Strain (CARDIA)    Difficulty of Paying Living Expenses: Not hard at all  Food Insecurity: No Food Insecurity (11/04/2022)   Hunger Vital Sign    Worried About Running Out of Food in the Last Year: Never true    Ran Out of Food in the Last Year: Never true  Transportation Needs: No Transportation Needs (11/04/2022)   PRAPARE - Hydrologist (Medical): No    Lack of Transportation (Non-Medical): No  Physical Activity: Not on file  Stress: Not on file  Social Connections: Not on file   Education Assessment and Provision:  Detailed education and instructions provided on heart failure disease management including the following:  Signs and symptoms of Heart Failure When to call the physician Importance of daily weights Low sodium diet Fluid restriction Medication management Anticipated future follow-up appointments  Patient education given on each of the above topics.  Patient acknowledges understanding via teach back method and acceptance of all instructions.  Education Materials:   "Living Better With Heart Failure" Booklet, HF zone tool, & Daily Weight Tracker Tool.  Patient has scale at home: yes Patient has pill box at home: NA    High Risk Criteria for Readmission and/or Poor Patient Outcomes: Heart failure hospital admissions (last 6 months): 0  No Show rate: 0 Difficult social situation: No Demonstrates medication adherence: Yes Primary Language: english Literacy level: reading, writing, and comprehension.   Barriers of Care:   Diet/ fluids/ daily weights ( pepsi x 2 day, salt) Smoking cessation Continued HF education  Considerations/Referrals:   Referral made to Heart Failure Pharmacist Stewardship: Yes Referral made to Heart Failure CSW/NCM TOC: No Referral made to Heart & Vascular TOC clinic: Yes, HF TOC 11/22/2022 @ 10 am.   Items for Follow-up on DC/TOC: Diet/ fluids/ daily weights ( pepsi x 2 per day, salt) Smoking cessation Continued HF education   Earnestine Leys, BSN, RN Heart Failure Leisure centre manager Chat Only

## 2022-11-04 NOTE — Progress Notes (Addendum)
CARDIOLOGY ROUNDING PROGRESS NOTE  Patient Name: Daniel Bautista Date of Encounter: 11/04/2022  [facility] Cardiologist: None   Patient Profile     62 y.o. male with hx of smoking, Fhx of paternal CAD in 80s transferred to [facility] from [facility] for emergent cardiac catheterization in the setting of anterior and inferior STEMI. Patient is now s/p PCXI with x2 DES to RCA with Dr. Ellyn Hack   Principal Problem:   Acute ST elevation myocardial infarction (STEMI) of inferior wall (HCC) Active Problems:   Hyperlipidemia associated with type 2 diabetes mellitus ([place])   Coronary artery disease involving native coronary artery of native heart with unstable angina pectoris ([place])   Presence of drug coated stent in right coronary artery   Current smoker   Ischemic cardiomyopathy   Type 2 diabetes mellitus with complication, without long-term current use of insulin (HCC)   Subjective    O/N: Feeling much better this morning. Denies chest pain, shortness of breath, or presyncopal symptoms with exertion. Denies nausea or vomiting  Discussed increased blood glucose but denies catabolic symptoms. Understands need for glucose control and need for diabetes medications in addition to medical management of his CAD and ischemic cardiomyopathy. Patient in agreement  ROS:  All other ROS reviewed and negative. Pertinent positives noted in the HPI.     Inpatient Medications  Scheduled Meds:  aspirin EC  81 mg Oral Daily   atorvastatin  80 mg Oral Daily   heparin  5,000 Units Subcutaneous Q8H   insulin aspart  0-15 Units Subcutaneous TID WC   insulin aspart  0-5 Units Subcutaneous QHS   metoprolol tartrate  25 mg Oral BID   sodium chloride flush  3 mL Intravenous Q12H   ticagrelor  90 mg Oral BID   Continuous Infusions:  sodium chloride Stopped (11/03/22 0001)   sodium chloride     PRN Meds: sodium chloride, acetaminophen, morphine injection, nitroGLYCERIN, ondansetron (ZOFRAN) IV, sodium  chloride flush   Vital Signs    Vitals:   11/03/22 1659 11/03/22 2135 11/04/22 0500 11/04/22 0532  BP: 121/86 134/79  (!) 103/59  Pulse: (!) 58 65    Resp: 18 18  18   Temp: 97.7 F (36.5 C) 97.8 F (36.6 C)  97.7 F (36.5 C)  TempSrc: Oral Oral  Oral  SpO2: 100% 98%    Weight:   78.6 kg   Height:        Intake/Output Summary (Last 24 hours) at 11/04/2022 0802 Last data filed at 11/03/2022 2145 Gross per 24 hour  Intake 600 ml  Output --  Net 600 ml      11/04/2022    5:00 AM 11/03/2022    2:03 PM 11/03/2022    5:00 AM  Last 3 Weights  Weight (lbs) 173 lb 3.2 oz 177 lb 12.8 oz 175 lb 14.4 oz  Weight (kg) 78.563 kg 80.65 kg 79.788 kg      Telemetry  Overnight telemetry shows ST segment elevation in inferior leads, which I personally reviewed.    Physical Exam   General: Main sitting in recliner in NAD Head: Atraumatic, normal size  Eyes: PEERLA, EOMI  Neck: Supple, no JVD Endocrine: No thryomegaly Cardiac: RRR; no murmurs, rubs, or gallops Lungs: Clear to auscultation bilaterally, no wheezing, rhonchi or rales  Abd: Soft, nontender, no hepatomegaly  Ext: No edema, Radial LHC access site with bandage without ecchymoses or edema. Normal Allen's and reverse Barbeau's test.  Musculoskeletal: No deformities, BUE and BLE strength  normal and equal Skin: Warm and dry, no rashes   Neuro: Alert and oriented x4, no focal neurologic deficits Psych: Normal mood and affect   ECG  The most recent ECG shows ST segment elevation in the inferior and anterior leads unchanged compared to prior, which I personally reviewed.   Labs    High Sensitivity Troponin:   Recent Labs  Lab 11/02/22 1552 11/02/22 1943  TROPONINIHS 4 7,810*     Chemistry Recent Labs  Lab 11/02/22 1552 11/02/22 1943 11/03/22 0028  NA 131*  --  131*  K 3.8  --  4.5  CL 96*  --  101  CO2 24  --  22  GLUCOSE 339*  --  285*  BUN 14  --  14  CREATININE 1.14 1.08 1.03  CALCIUM 9.2  --  9.0  MG 2.2   --   --   PROT 7.6  --   --   ALBUMIN 4.1  --   --   AST 22  --   --   ALT 26  --   --   ALKPHOS 77  --   --   BILITOT 0.8  --   --   GFRNONAA >60 >60 >60  ANIONGAP 11  --  8    Lipids  Recent Labs  Lab 11/03/22 0028  CHOL 216*  TRIG 116  HDL 39*  LDLCALC 154*  CHOLHDL 5.5    Hematology Recent Labs  Lab 11/02/22 1943 11/03/22 0028 11/04/22 0205  WBC 15.1* 12.6* 11.8*  RBC 6.08* 5.74 5.64  HGB 17.0 16.1 15.8  HCT 49.3 46.1 45.0  MCV 81.1 80.3 79.8*  MCH 28.0 28.0 28.0  MCHC 34.5 34.9 35.1  RDW 12.4 12.4 12.6  PLT 266 247 224   Thyroid  Recent Labs  Lab 11/02/22 1943  TSH 0.729    BNPNo results for input(s): "BNP", "PROBNP" in the last 168 hours.  DDimer No results for input(s): "DDIMER" in the last 168 hours.   Radiology    ECHOCARDIOGRAM COMPLETE  Result Date: 11/03/2022    ECHOCARDIOGRAM REPORT   Patient Name:   Daniel Bautista Date of Exam: 11/03/2022 Medical Rec #:  [id number]         Height:       68.0 in Accession #:    [id number]        Weight:       175.9 lb Date of Birth:  [birth year]         BSA:          1.935 m Patient Age:    62 years          BP:           113/76 mmHg Patient Gender: M                 HR:           62 bpm. Exam Location:  Inpatient Procedure: 2D Echo, Cardiac Doppler, Color Doppler and Intracardiac            Opacification Agent Indications:    R07.9* Chest pain, unspecified  History:        Patient has no prior history of Echocardiogram examinations.                 Signs/Symptoms:Chest Pain.  Sonographer:    Rolla Etienne Referring Phys: Margie Billet IMPRESSIONS  1. Left ventricular ejection fraction, by estimation, is 35 to 40%. The left ventricle has  moderately decreased function. The left ventricle demonstrates global hypokinesis. Left ventricular diastolic parameters are consistent with Grade II diastolic dysfunction (pseudonormalization). There is the interventricular septum is flattened in diastole ('D' shaped left ventricle),  consistent with right ventricular volume overload.  2. Right ventricular systolic function is mildly reduced. The right ventricular size is mildly enlarged. Tricuspid regurgitation signal is inadequate for assessing PA pressure.  3. The mitral valve is normal in structure. No evidence of mitral valve regurgitation. No evidence of mitral stenosis.  4. The aortic valve is tricuspid. There is mild calcification of the aortic valve. Aortic valve regurgitation is not visualized. No aortic stenosis is present.  5. The inferior vena cava is normal in size with <50% respiratory variability, suggesting right atrial pressure of 8 mmHg. FINDINGS  Left Ventricle: Left ventricular ejection fraction, by estimation, is 35 to 40%. The left ventricle has moderately decreased function. The left ventricle demonstrates global hypokinesis. Definity contrast agent was given IV to delineate the left ventricular endocardial borders. The left ventricular internal cavity size was normal in size. There is no left ventricular hypertrophy. The interventricular septum is flattened in diastole ('D' shaped left ventricle), consistent with right ventricular volume overload. Left ventricular diastolic parameters are consistent with Grade II diastolic dysfunction (pseudonormalization). Right Ventricle: The right ventricular size is mildly enlarged. No increase in right ventricular wall thickness. Right ventricular systolic function is mildly reduced. Tricuspid regurgitation signal is inadequate for assessing PA pressure. Left Atrium: Left atrial size was normal in size. Right Atrium: Right atrial size was normal in size. Pericardium: There is no evidence of pericardial effusion. Mitral Valve: The mitral valve is normal in structure. There is mild calcification of the mitral valve leaflet(s). No evidence of mitral valve regurgitation. No evidence of mitral valve stenosis. Tricuspid Valve: The tricuspid valve is normal in structure. Tricuspid valve  regurgitation is not demonstrated. Aortic Valve: The aortic valve is tricuspid. There is mild calcification of the aortic valve. Aortic valve regurgitation is not visualized. No aortic stenosis is present. Aortic valve mean gradient measures 3.0 mmHg. Aortic valve peak gradient measures 5.0 mmHg. Aortic valve area, by VTI measures 2.35 cm. Pulmonic Valve: The pulmonic valve was normal in structure. Pulmonic valve regurgitation is trivial. Aorta: The aortic root is normal in size and structure. Venous: The inferior vena cava is normal in size with less than 50% respiratory variability, suggesting right atrial pressure of 8 mmHg. IAS/Shunts: No atrial level shunt detected by color flow Doppler.  LEFT VENTRICLE PLAX 2D LVIDd:         3.70 cm     Diastology LVIDs:         2.50 cm     LV e' medial:    7.51 cm/s LV PW:         1.10 cm     LV E/e' medial:  9.4 LV IVS:        1.10 cm     LV e' lateral:   9.14 cm/s LVOT diam:     2.00 cm     LV E/e' lateral: 7.7 LV SV:         54 LV SV Index:   28 LVOT Area:     3.14 cm  LV Volumes (MOD) LV vol d, MOD A2C: 64.4 ml LV vol d, MOD A4C: 77.0 ml LV vol s, MOD A2C: 30.6 ml LV vol s, MOD A4C: 48.5 ml LV SV MOD A2C:     33.8 ml LV SV MOD A4C:  77.0 ml LV SV MOD BP:      34.4 ml RIGHT VENTRICLE RV Basal diam:  3.20 cm RV Mid diam:    3.00 cm RV S prime:     10.00 cm/s TAPSE (M-mode): 1.6 cm LEFT ATRIUM             Index        RIGHT ATRIUM           Index LA diam:        3.20 cm 1.65 cm/m   RA Area:     13.00 cm LA Vol (A2C):   22.3 ml 11.52 ml/m  RA Volume:   30.30 ml  15.66 ml/m LA Vol (A4C):   20.8 ml 10.75 ml/m LA Biplane Vol: 23.4 ml 12.09 ml/m  AORTIC VALVE AV Area (Vmax):    2.39 cm AV Area (Vmean):   2.16 cm AV Area (VTI):     2.35 cm AV Vmax:           111.50 cm/s AV Vmean:          76.200 cm/s AV VTI:            0.230 m AV Peak Grad:      5.0 mmHg AV Mean Grad:      3.0 mmHg LVOT Vmax:         84.90 cm/s LVOT Vmean:        52.400 cm/s LVOT VTI:          0.172  m LVOT/AV VTI ratio: 0.75 MITRAL VALVE MV Area (PHT): 3.89 cm    SHUNTS MV Decel Time: 195 msec    Systemic VTI:  0.17 m MV E velocity: 70.70 cm/s  Systemic Diam: 2.00 cm MV A velocity: 66.00 cm/s MV E/A ratio:  1.07 Dalton McleanMD Electronically signed by Franki Monte Signature Date/Time: 11/03/2022/1:48:57 PM    Final    CARDIAC CATHETERIZATION  Result Date: 11/02/2022   Lesion 1a: Prox RCA lesion is 55% stenosed.  Lestion 1b (CULPIT): Prox RCA to Dist RCA lesion is 100% stenosed with 100% stenosed side branch in Acute Mrg.   A drug-eluting stent was successfully placed covering the majority of the Culprit Lesion using a SYNERGY XD 2.50X48 -> deployed to 2.6 mm with the middle 1/3 postdilated to 2.8 mm, and proximal 1/3 postdilated to 3.1 mm.   A drug-eluting stent was successfully placed covering the proximal end of the initial stent and Lesion 1 a using a SYNERGY XD 2.75X12.  Postdilated to 3.1 mm.   Balloon angioplasty was performed on the acute marginal side branch using a BALLN EMERGE MR 2.5X12.   Post intervention, there is a 0% residual stenosis throughout the stented segment..   Post intervention, the Acute Margina side branch was reduced to 0% residual stenosis.   ------ Dist RCA lesion is 65% stenosed with 60% stenosed side branch in RPDA.   1st Diag lesion is 80% stenosed. .   1st Mrg lesion is 90% stenosed   --------------------------------------------------------   The left ventricular systolic function is normal.   LV end diastolic pressure is normal.   The left ventricular ejection fraction is 50-55% by visual estimate.   There is no aortic valve stenosis. POST-OPERATIVE DIAGNOSIS:  Culprit lesion: Heavily thrombotic ulcerated and calcified proximal to mid 100% occlusion down around the crux after an RV marginal branch.  (After angioplasty and stenting there is a long area of the distal RCA into the PDA which is about 65% stenosed  that improved some with nitroglycerin-plan will be to treat  this medically and reassess so as not to pass stents through a brand-new stent) Successful DES PCI using Aggrastat and extensive balloon dilation from proximal to near distal RCA.  The extensive lesion was covered using overlapping Synergy DES stents (2.5 mm x 48 mm distal and proximally overlapped with a 2.75 mm x 12 mm stent) the stented segment was postdilated to 3.1 mm proximally down to 2.8 mm in the mid mid stent with the distal segment being postdilated up to 2.6 mm. TIMI-3 flow reduced to TIMI 0 flow.  Inferior ST elevations resolved on EKG however anterior elevations persist, despite no lesion to explain it. No LAD disease with mid D1 80% (small-moderate caliber vessel-borderline PCI target)-unable to explain anterior ST elevations LCx was into the posterior groove to be codominant with 3 posterolateral branches after giving off OM1 that has a proximal 90% focal hinge lesion.  LPL 1 has a ostial 50% stenosis Relatively preserved LVEF with no obvious wall motion abnormality.  Recommend 2D echo for better evaluation.  LVEDP was 14 mmHg. PLAN OF CARE: Admit to inpatient  -> run Aggrastat for 6 hours.  DAPT for minimum 1 year. Check 2D Echo.  Check FLP, A1c High-dose atorvastatin, metoprolol 25 mg twice daily Would ask ICU colleagues to review the angiographic films for the diagonal and OM branches to see if there amenable to PCI.  In my opinion there may be too small for PCI.  Can be reassessed with outpatient stress test evaluation which would also allow [place] to assess the distal RCA disease. Smokes cessation counseling.  Cardiac Rehab Phase 1 and Phase 2 Consult Placed. Glenetta Hew, MD  DG Chest Port 1 View  Result Date: 11/02/2022 CLINICAL DATA:  Chest pain. EXAM: PORTABLE CHEST 1 VIEW COMPARISON:  Chest radiograph dated 02/19/2009. FINDINGS: The heart size and mediastinal contours are within normal limits. Both lungs are clear. The visualized skeletal structures are unremarkable. IMPRESSION: No active  disease. Electronically Signed   By: Anner Crete M.D.   On: 11/02/2022 18:48    Cardiac Studies - Summarized  04/04/2023  Culprit lesion: Heavily thrombotic ulcerated and calcified proximal to mid 100% occlusion down around the crux after an RV marginal branch.  (After angioplasty and stenting there is a long area of the distal RCA into the PDA which is about 65% stenosed that improved some with nitroglycerin plan will be to treat this medically and reassess so as not to pass stents through a brand-new stent) Successful DES PCI using Aggrastat and extensive balloon dilation from proximal to near distal RCA.  The extensive lesion was covered using overlapping Synergy DES stents (2.5 mm x 48 mm distal and proximally overlapped with a 2.75 mm x 12 mm stent) the stented segment was postdilated to 3.1 mm proximally down to 2.8 mm in the mid mid stent with the distal segment being postdilated up to 2.6 mm. TIMI-3 flow reduced to TIMI 0 flow.  Inferior ST elevations resolved on EKG however anterior elevations persist, despite no lesion to explain it. No LAD disease with mid D1 80% (small-moderate caliber vessel-borderline PCI target) Unable to explain anterior ST elevations LCx was into the posterior groove to be codominant with 3 posterolateral branches after giving off OM1 that has a proximal 90% focal hinge lesion.  LPL 1 has a ostial 50% stenosis Relatively preserved LVEF with no obvious wall motion abnormality.  Recommend 2D echo for better evaluation.  LVEDP was  14 mmHg.    11/03/22 2D ECHOCARDIOGRAM IMPRESSIONS   1. Left ventricular ejection fraction, by estimation, is 35 to 40%. The left ventricle has moderately decreased function. The left ventricle demonstrates global hypokinesis. Left ventricular diastolic parameters are consistent with Grade II diastolic dysfunction (pseudonormalization). There is the interventricular septum is flattened in diastole ('D' shaped left ventricle), consistent  with right ventricular volume overload.   2. Right ventricular systolic function is mildly reduced. The right ventricular size is mildly enlarged. Tricuspid regurgitation signal is inadequate for assessing PA pressure.   3. The mitral valve is normal in structure. No evidence of mitral valve regurgitation. No evidence of mitral stenosis.   4. The aortic valve is tricuspid. There is mild calcification of the aortic valve. Aortic valve regurgitation is not visualized. No aortic stenosis is present.   5. The inferior vena cava is normal in size with <50% respiratory variability, suggesting right atrial pressure of 8 mmHg.   Assessment & Plan   Anterior - inferior STEMI Multivessel CAD S/p PCI with x2 DES to RCA Troponin from 4- >7K.Transferred to [facility] for emergent catheterization. Now s/p PCI with 2x DES to RCA. There is residual CAD in first obtuse marginal artery with 90% stenosis and D1 diagonal branch off LAD with 80% stenosis, with plans to manage with pharmacotherapy. No other PCIs scheduled for this hospitalization. Euvolemic on exam today.  -Continue Brillinta 90 mg BID and ASA 81 mg daily for 12 months -Continue on metoprolol tartrate 25 mg BID -Lipitor 80 mg daily -Nitroglycerine PRN -Follow up 2D echocardiogram today -Cardiac rehab  Ischemic cardiomyopathy HFrEF, 35-40%, global hypokinesis Flattened interventricular septum on diastole Patient with reduced LV ejection fraction to 35-40%, global hypokinesis and flattened interventricular septum during diastole, concerning for RV volume overload. No valvular disease noted. Patient currently on betablocker. Could start patient on low dose Aldactone today.  -Continue on metoprolol tartrate 25 mg BID -Start low dose Aldactone today 12.5 mg daily -Start on SGLT2i today given no absolute contraindication to starting it in patients with A1c >10 -Reassess ability to escalate GDMT at follow up -Repeat TTE in ~3 months -Monitor volume status;  consider PRN diuresis -Patient to follow with HF navigator   Hyperglycemia A1c 11.7; persistently hypoglycemic overnight, responding to SSI -Will start on Semglee 12 units daily today -SSI with bedtime correction -Appreciate diabetes educator assistance as patient is insulin naive and a new to diabetic medications  HLD LDL 154 -Started atorvastatin 80 mg daily  Leukocytosis Likely reactive; continues to downtrend. No signs of infection on exam -Will continue to monitor   Discussed current plan with attending, Dr. Irish Lack, during rounds.     Romana Juniper, MD [facility] Program 11/04/2022, 8:02 AM For questions or updates, please contact [place] Please consult [email or web address] for contact info under      I have examined the patient and reviewed assessment and plan and discussed with patient.  Agree with above as stated.    Doing well from a cardiac standpoint.  Diabetes has been poorly controlled.  Adjusting medications.  Adding SGLT2 inhibitor.  Adding spironolactone.  Will keep him in the hospital to make these adjustments.  I stressed the importance of diabetes management for his overall heart health.  High-dose statin along with dual antiplatelet therapy as well.  EF down in the 35 to 40% range.  Will see how blood pressure tolerates additional heart failure medicines.  Larae Grooms

## 2022-11-04 NOTE — TOC Benefit Eligibility Note (Addendum)
Patient Teacher, English as a foreign language completed.    The patient is currently admitted and upon discharge could be taking Entresto 24-26 mg.  The current 30 day co-pay is $190.00 due to a $150.00 deductible will be $40.00 once deducible is met.   The patient is currently admitted and upon discharge could be taking Levemir Pen.  The current 30 day co-pay is $10.00.   The patient is currently admitted and upon discharge could be taking [place] FlexPen 100 unit/ml.  The current 30 day co-pay is $10.00.   The patient is currently admitted and upon discharge could be taking Lantus Pen.  Product Non Formulary  The patient is insured through [place] [employer] [place], [place] [facility] [place] Patient Advocate Team Direct Number: [phone]  Fax: [phone]

## 2022-11-04 NOTE — Progress Notes (Signed)
   Heart Failure Stewardship Pharmacist Progress Note   PCP: Pcp, No PCP-Cardiologist: None    HPI:  62 yo M with PMH of tobacco use and family history of CAD.   [facility] with chest pain and diaphoresis. EKG obtained and showed inferior ST elevations. Taken to [facility] cath for STEMI. 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 LVEDP 14. ECHO 3/20 showed LVEF 35-40%, global hypokinesis, G2DD, RV mildly reduced.   Current HF Medications: Beta Blocker: metoprolol tartrate 25 mg BID  Prior to admission HF Medications: None  Pertinent Lab Values: Serum creatinine 0.96, BUN 15, Potassium 3.8, Sodium 135, Magnesium 2.2, A1c 11.7   Vital Signs: Weight: 173 lbs (admission weight: 177 lbs) Blood pressure: 110/60-80s  Heart rate: 70-80s   Medication Assistance / Insurance Benefits Check: Does the patient have prescription insurance?  Yes Type of insurance plan: [employer]  Outpatient Pharmacy:  Prior to admission outpatient pharmacy: [facility] Is the patient willing to use [place] at discharge? Yes Is the patient willing to transition their outpatient pharmacy to utilize a [facility] outpatient pharmacy?   Pending    Assessment: 1. Acute systolic CHF (LVEF 123456), due to ICM. NYHA class II symptoms. - Not volume overloaded on exam, no indication for diuretics - Continue metoprolol tartrate 25 mg BID, will need to consolidate to metoprolol XL for discharge with HFrEF - Consider adding ARB prior to discharge vs at follow up pending BP trends - Consider adding low dose MRA with SGLT2i today to optimize GDMT. Will educate thoroughly on risk with SGLT2i with elevated A1c. Appreciate diabetes coordinator assistance for education on new diagnosis.    Plan: 1) Medication changes recommended at this time: - Add spironolactone 12.5 mg  daily - Add Farxiga 10 mg daily  2) Patient assistance: [place] copay $40 - copay card lowers to $0 per month - Jardiance copay $190 - copay card will only cover up to $175 per month - Entresto copay $190 - copay card lowers to $10 per fill  3)  Education  - To be completed prior to discharge  Kerby Nora, PharmD, BCPS Heart Failure Stewardship Pharmacist Phone [phone]

## 2022-11-04 NOTE — Progress Notes (Signed)
CARDIAC REHAB PHASE I   PRE:  Rate/Rhythm: 74 NSR  BP:  Sitting: 115/82      SaO2: 95 RA  MODE:  Ambulation: 470 ft   AD:  None  POST:  Rate/Rhythm: 86 NSR  BP:  Sitting: 148/84      SaO2: 96 RA  Pt amb with standby assistance, pt denies CP and SOB during amb and was returned to room w/o complaint.   Pt walked well, pt denies questions from teachings yesterday. Gave recommendations on ways to manage T2DM through exercise.   Daniel Bautista  8:43 AM 11/04/2022    Service time is from [place] to [phone].

## 2022-11-05 ENCOUNTER — Other Ambulatory Visit (HOSPITAL_COMMUNITY): Payer: Self-pay

## 2022-11-05 DIAGNOSIS — E1169 Type 2 diabetes mellitus with other specified complication: Secondary | ICD-10-CM | POA: Diagnosis not present

## 2022-11-05 DIAGNOSIS — I2119 ST elevation (STEMI) myocardial infarction involving other coronary artery of inferior wall: Secondary | ICD-10-CM | POA: Diagnosis not present

## 2022-11-05 DIAGNOSIS — E785 Hyperlipidemia, unspecified: Secondary | ICD-10-CM | POA: Diagnosis not present

## 2022-11-05 DIAGNOSIS — Z955 Presence of coronary angioplasty implant and graft: Secondary | ICD-10-CM | POA: Diagnosis not present

## 2022-11-05 LAB — CBC
HCT: 45.3 % (ref 39.0–52.0)
Hemoglobin: 15.7 g/dL (ref 13.0–17.0)
MCH: 27.8 pg (ref 26.0–34.0)
MCHC: 34.7 g/dL (ref 30.0–36.0)
MCV: 80.3 fL (ref 80.0–100.0)
Platelets: 241 10*3/uL (ref 150–400)
RBC: 5.64 MIL/uL (ref 4.22–5.81)
RDW: 12.5 % (ref 11.5–15.5)
WBC: 11.1 10*3/uL — ABNORMAL HIGH (ref 4.0–10.5)
nRBC: 0 % (ref 0.0–0.2)

## 2022-11-05 LAB — GLUCOSE, CAPILLARY: Glucose-Capillary: 392 mg/dL — ABNORMAL HIGH (ref 70–99)

## 2022-11-05 MED ORDER — ATORVASTATIN CALCIUM 80 MG PO TABS
80.0000 mg | ORAL_TABLET | Freq: Every day | ORAL | 3 refills | Status: DC
  Filled 2022-11-05: qty 90, 90d supply, fill #0

## 2022-11-05 MED ORDER — GLUCOSE BLOOD VI STRP
ORAL_STRIP | 0 refills | Status: DC
  Filled 2022-11-05: qty 100, 25d supply, fill #0

## 2022-11-05 MED ORDER — INSULIN GLARGINE-YFGN 100 UNIT/ML ~~LOC~~ SOPN
12.0000 [IU] | PEN_INJECTOR | Freq: Every day | SUBCUTANEOUS | 3 refills | Status: DC
  Filled 2022-11-05: qty 9, 75d supply, fill #0

## 2022-11-05 MED ORDER — ACCU-CHEK GUIDE W/DEVICE KIT
PACK | 0 refills | Status: DC
  Filled 2022-11-05: qty 1, 7d supply, fill #0

## 2022-11-05 MED ORDER — TICAGRELOR 90 MG PO TABS
90.0000 mg | ORAL_TABLET | Freq: Two times a day (BID) | ORAL | 2 refills | Status: DC
  Filled 2022-11-05: qty 180, 90d supply, fill #0

## 2022-11-05 MED ORDER — INSULIN PEN NEEDLE 32G X 4 MM MISC
1.0000 | Freq: Every day | 3 refills | Status: AC
  Filled 2022-11-05: qty 100, 90d supply, fill #0

## 2022-11-05 MED ORDER — SPIRONOLACTONE 25 MG PO TABS
12.5000 mg | ORAL_TABLET | Freq: Every day | ORAL | 11 refills | Status: DC
  Filled 2022-11-05: qty 15, 30d supply, fill #0

## 2022-11-05 MED ORDER — METFORMIN HCL 500 MG PO TABS
500.0000 mg | ORAL_TABLET | Freq: Every day | ORAL | 11 refills | Status: DC
  Filled 2022-11-05: qty 30, 30d supply, fill #0

## 2022-11-05 MED ORDER — SACUBITRIL-VALSARTAN 24-26 MG PO TABS
1.0000 | ORAL_TABLET | Freq: Two times a day (BID) | ORAL | Status: DC
  Administered 2022-11-05: 1 via ORAL
  Filled 2022-11-05: qty 1

## 2022-11-05 MED ORDER — ASPIRIN 81 MG PO TBEC
81.0000 mg | DELAYED_RELEASE_TABLET | Freq: Every day | ORAL | 3 refills | Status: AC
  Filled 2022-11-05: qty 90, 90d supply, fill #0

## 2022-11-05 MED ORDER — ACCU-CHEK SOFTCLIX LANCETS MISC
0 refills | Status: DC
  Filled 2022-11-05: qty 100, 25d supply, fill #0

## 2022-11-05 MED ORDER — NITROGLYCERIN 0.4 MG SL SUBL
0.4000 mg | SUBLINGUAL_TABLET | SUBLINGUAL | 0 refills | Status: AC | PRN
  Filled 2022-11-05: qty 25, 7d supply, fill #0

## 2022-11-05 MED ORDER — SACUBITRIL-VALSARTAN 24-26 MG PO TABS
1.0000 | ORAL_TABLET | Freq: Two times a day (BID) | ORAL | 11 refills | Status: DC
  Filled 2022-11-05: qty 60, 30d supply, fill #0

## 2022-11-05 MED ORDER — METOPROLOL SUCCINATE ER 50 MG PO TB24
50.0000 mg | ORAL_TABLET | Freq: Every day | ORAL | 3 refills | Status: DC
  Filled 2022-11-05: qty 90, 90d supply, fill #0

## 2022-11-05 NOTE — Progress Notes (Signed)
Mobility Specialist Progress Note:   11/05/22 1000  Mobility  Activity Ambulated independently in hallway  Level of Assistance Independent  Assistive Device None  Distance Ambulated (ft) 400 ft  Activity Response Tolerated well  Mobility Referral Yes  $Mobility charge 1 Mobility   Pt agreeable to mobility session. No assistance required. Pt back sitting EOB with all needs met.   Nelta Numbers Mobility Specialist Please contact via SecureChat or  Rehab office at [phone]

## 2022-11-05 NOTE — Progress Notes (Addendum)
   Heart Failure Stewardship Pharmacist Progress Note   PCP: Pcp, No PCP-Cardiologist: None    HPI:  62 yo M with PMH of tobacco use and family history of CAD.   [facility]19 with chest pain and diaphoresis. EKG obtained and showed inferior ST elevations. Taken to [facility] cath for STEMI. 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 LVEDP 14. ECHO 3/20 showed LVEF 35-40%, global hypokinesis, G2DD, RV mildly reduced.   Current HF Medications: Beta Blocker: metoprolol tartrate 25 mg BID MRA: spironolactone 12.5 mg daily SGLT2i: Farxiga 10 mg daily  Prior to admission HF Medications: None  Pertinent Lab Values: Serum creatinine 0.96, BUN 15, Potassium 3.8, Sodium 135, Magnesium 2.2, A1c 11.7   Vital Signs: Weight: 170 lbs (admission weight: 177 lbs) Blood pressure: 110-130/70s  Heart rate: 70-80s   Medication Assistance / Insurance Benefits Check: Does the patient have prescription insurance?  Yes Type of insurance plan: [employer]  Outpatient Pharmacy:  Prior to admission outpatient pharmacy: [facility] Is the patient willing to use [place] at discharge? Yes Is the patient willing to transition their outpatient pharmacy to utilize a [facility] outpatient pharmacy?   No    Assessment: 1. Acute systolic CHF (LVEF 123456), due to ICM. NYHA class II symptoms. - Not volume overloaded on exam, no indication for diuretics - Continue metoprolol tartrate 25 mg BID, will need to consolidate to metoprolol XL for discharge with HFrEF - Consider adding ARB at follow up pending BP trends - Continue spironolactone 12.5 mg daily - Continue Farxiga 10 mg daily. Educated thoroughly on risk with SGLT2i with elevated A1c. Appreciate diabetes coordinator assistance for education on new diagnosis.    Plan: 1) Medication changes recommended at this  time: - Change to metoprolol succinate for discharge  2) Patient assistance: - Farxiga copay $40 - copay card lowers to $0 per month - Jardiance copay $190 - copay card will only cover up to $175 per month - Entresto copay $190 - copay card lowers to $10 per fill  3)  Education  - Patient has been educated on current HF medications and potential additions to HF medication regimen - Patient verbalizes understanding that over the next few months, these medication doses may change and more medications may be added to optimize HF regimen - Patient has been educated on basic disease state pathophysiology and goals of therapy   Kerby Nora, PharmD, BCPS Heart Failure Stewardship Pharmacist Phone [phone]

## 2022-11-05 NOTE — Care Management (Signed)
1132 11-05-22 Case Manager received a notification to arrange PCP for the patient. Case Manager spoke with patient regarding PCP; daughter at the bedside and she will aide in establishing a PCP for the patient. No further needs identified.

## 2022-11-05 NOTE — Progress Notes (Signed)
CARDIAC REHAB PHASE I   PRE:  Rate/Rhythm: 78 NSR  BP:  Sitting: 108/73      SaO2: 97 RA  MODE:  Ambulation: 470 ft   AD:  None  POST:  Rate/Rhythm: 88 NSR  BP:  Sitting: 133/77      SaO2: 97 RA  Pt amb with standby assistance, pt denies CP and SOB during amb and was returned to room w/o complaint.   Pt walked well w/o complaint  Christen Bame  9:04 AM 11/05/2022    Service time is from 0854 to 0955.

## 2022-11-05 NOTE — Inpatient Diabetes Management (Addendum)
Inpatient Diabetes Program Recommendations  AACE/ADA: New Consensus Statement on Inpatient Glycemic Control (2015)  Target Ranges:  Prepandial:   less than 140 mg/dL      Peak postprandial:   less than 180 mg/dL (1-2 hours)      Critically ill patients:  140 - 180 mg/dL   Lab Results  Component Value Date   [id number] (H) 11/05/2022   HGBA1C 11.7 (H) 11/03/2022    Review of Glycemic Control  Diabetes history: DM2 Outpatient Diabetes medications: None Current orders for Inpatient glycemic control: Semglee 12 units QD, Novolog 0-15 units TID  Late entry- Spoke with patient and family on 3/21 at bedside.   Discussed A1C results with them and explained what an A1C is, basic pathophysiology of DM Type 2, basic home care, basic diabetes diet nutrition principles, importance of checking CBGs and maintaining good CBG control to prevent long-term and short-term complications. Reviewed signs and symptoms of hyperglycemia and hypoglycemia and how to treat hypoglycemia at home. Also reviewed blood sugar goals at home.  RNs to provide ongoing basic DM education at bedside with this patient. Have ordered educational booklet, insulin starter kit, and DM videos. Have also placed RD consult for DM diet education for this patient.   Ordered the Living Well with Diabetes booklet which is at bedside.  Educated on The Plate Method, CHO's, portion control, CBGs at home fasting and mid afternoon, F/U with PCP every 3 months, bring meter to PCP office, long and short term complications of uncontrolled BG, and importance of exercise.  For discharge please order:  Novolog Flex pen-order # [id number] Tresiba insulin pen-order # [id number] Insulin pen needles-Order # [id number] Glucometer and supplies-Order # [id number] Freestyle Libre 3 CGM-Order # [id number]  Will see patient again today and teach insulin administration.    Addendum@11 :45:  Educated patient on insulin pen use at home. Reviewed contents of insulin flexpen  starter kit. Reviewed all steps of insulin pen including attachment of needle, 2-unit air shot, dialing up dose, giving injection, removing needle, disposal of sharps, storage of unused insulin, disposal of insulin etc. Patient able to provide successful return demonstration. Also reviewed troubleshooting with insulin pen. MD to give patient Rxs for insulin pens and insulin pen needles.  Reviewed hypoglycemia, signs, symptoms and treatments.    Please consider for DC:  Novolog 3 units TID with meals  Tresiba 12 units QHS   Will continue to follow while inpatient.  Thank you, Reche Dixon, MSN, [place] Diabetes Coordinator Inpatient Diabetes Program [phone] (team pager from 8a-5p)

## 2022-11-05 NOTE — Discharge Summary (Addendum)
Discharge Summary    Patient ID: Daniel Bautista MRN: [id number]; DOB: Oct 19, 1960  Admit date: 11/02/2022 Discharge date: 11/05/2022  PCP:  Merryl Hacker, No   [facility] Prov[facility] Cardiologist:  Glenetta Hew, MD     Discharge Diagnoses    Principal Problem:   Acute ST elevation myocardial infarction (STEMI) of inferior wall ([place]) Active Problems:   Hyperlipidemia associated with type 2 diabetes mellitus ([place])   Coronary artery disease involving native coronary artery of native heart with unstable angina pectoris ([place])   Presence of drug coated stent in right coronary artery   Current smoker   Ischemic cardiomyopathy   Type 2 diabetes mellitus with complication, without long-term current use of insulin ([place])  Diagnostic Studies/Procedures    Cath: 11/02/2022    Lesion 1a: Prox RCA lesion is 55% stenosed.  Lestion 1b (CULPIT): Prox RCA to Dist RCA lesion is 100% stenosed with 100% stenosed side branch in Acute Mrg.   A drug-eluting stent was successfully placed covering the majority of the Culprit Lesion using a SYNERGY XD 2.50X48 -> deployed to 2.6 mm with the middle 1/3 postdilated to 2.8 mm, and proximal 1/3 postdilated to 3.1 mm.   A drug-eluting stent was successfully placed covering the proximal end of the initial stent and Lesion 1 a using a SYNERGY XD 2.75X12.  Postdilated to 3.1 mm.   Balloon angioplasty was performed on the acute marginal side branch using a BALLN EMERGE MR 2.5X12.   Post intervention, there is a 0% residual stenosis throughout the stented segment..   Post intervention, the Acute Margina side branch was reduced to 0% residual stenosis.   ------ Dist RCA lesion is 65% stenosed with 60% stenosed side branch in RPDA.   1st Diag lesion is 80% stenosed. .   1st Mrg lesion is 90% stenosed   --------------------------------------------------------   The left ventricular systolic function is normal.   LV end diastolic pressure is normal.   The left  ventricular ejection fraction is 50-55% by visual estimate.   There is no aortic valve stenosis.   POST-OPERATIVE DIAGNOSIS:   Culprit lesion: Heavily thrombotic ulcerated and calcified proximal to mid 100% occlusion down around the crux after an RV marginal branch.  (After angioplasty and stenting there is a long area of the distal RCA into the PDA which is about 65% stenosed that improved some with nitroglycerin-plan will be to treat this medically and reassess so as not to pass stents through a brand-new stent) Successful DES PCI using Aggrastat and extensive balloon dilation from proximal to near distal RCA.  The extensive lesion was covered using overlapping Synergy DES stents (2.5 mm x 48 mm distal and proximally overlapped with a 2.75 mm x 12 mm stent) the stented segment was postdilated to 3.1 mm proximally down to 2.8 mm in the mid mid stent with the distal segment being postdilated up to 2.6 mm. TIMI-3 flow reduced to TIMI 0 flow.  Inferior ST elevations resolved on EKG however anterior elevations persist, despite no lesion to explain it. No LAD disease with mid D1 80% (small-moderate caliber vessel-borderline PCI target)-unable to explain anterior ST elevations LCx was into the posterior groove to be codominant with 3 posterolateral branches after giving off OM1 that has a proximal 90% focal hinge lesion.  LPL 1 has a ostial 50% stenosis Relatively preserved LVEF with no obvious wall motion abnormality.  Recommend 2D echo for better evaluation.  LVEDP was 14 mmHg.    PLAN OF CARE: Admit  to inpatient  -> run Aggrastat for 6 hours.  DAPT for minimum 1 year. Check 2D Echo.   Check FLP, A1c High-dose atorvastatin, metoprolol 25 mg twice daily Would ask ICU colleagues to review the angiographic films for the diagonal and OM branches to see if there amenable to PCI.  In my opinion there may be too small for PCI.  Can be reassessed with outpatient stress test evaluation which would also allow [place]  to assess the distal RCA disease. Smokes cessation counseling.  Cardiac Rehab Phase 1 and Phase 2 Consult Placed.   Glenetta Hew, MD  Diagnostic Dominance: Co-dominant  Intervention    Echo: 11/03/2022  IMPRESSIONS     1. Left ventricular ejection fraction, by estimation, is 35 to 40%. The  left ventricle has moderately decreased function. The left ventricle  demonstrates global hypokinesis. Left ventricular diastolic parameters are  consistent with Grade II diastolic  dysfunction (pseudonormalization). There is the interventricular septum is  flattened in diastole ('D' shaped left ventricle), consistent with right  ventricular volume overload.   2. Right ventricular systolic function is mildly reduced. The right  ventricular size is mildly enlarged. Tricuspid regurgitation signal is  inadequate for assessing PA pressure.   3. The mitral valve is normal in structure. No evidence of mitral valve  regurgitation. No evidence of mitral stenosis.   4. The aortic valve is tricuspid. There is mild calcification of the  aortic valve. Aortic valve regurgitation is not visualized. No aortic  stenosis is present.   5. The inferior vena cava is normal in size with <50% respiratory  variability, suggesting right atrial pressure of 8 mmHg.   FINDINGS   Left Ventricle: Left ventricular ejection fraction, by estimation, is 35  to 40%. The left ventricle has moderately decreased function. The left  ventricle demonstrates global hypokinesis. Definity contrast agent was  given IV to delineate the left  ventricular endocardial borders. The left ventricular internal cavity size  was normal in size. There is no left ventricular hypertrophy. The  interventricular septum is flattened in diastole ('D' shaped left  ventricle), consistent with right ventricular  volume overload. Left ventricular diastolic parameters are consistent with  Grade II diastolic dysfunction (pseudonormalization).    Right Ventricle: The right ventricular size is mildly enlarged. No  increase in right ventricular wall thickness. Right ventricular systolic  function is mildly reduced. Tricuspid regurgitation signal is inadequate  for assessing PA pressure.   Left Atrium: Left atrial size was normal in size.   Right Atrium: Right atrial size was normal in size.   Pericardium: There is no evidence of pericardial effusion.   Mitral Valve: The mitral valve is normal in structure. There is mild  calcification of the mitral valve leaflet(s). No evidence of mitral valve  regurgitation. No evidence of mitral valve stenosis.   Tricuspid Valve: The tricuspid valve is normal in structure. Tricuspid  valve regurgitation is not demonstrated.   Aortic Valve: The aortic valve is tricuspid. There is mild calcification  of the aortic valve. Aortic valve regurgitation is not visualized. No  aortic stenosis is present. Aortic valve mean gradient measures 3.0 mmHg.  Aortic valve peak gradient measures  5.0 mmHg. Aortic valve area, by VTI measures 2.35 cm.   Pulmonic Valve: The pulmonic valve was normal in structure. Pulmonic valve  regurgitation is trivial.   Aorta: The aortic root is normal in size and structure.   Venous: The inferior vena cava is normal in size with less than 50%  respiratory variability, suggesting right atrial pressure of 8 mmHg.   IAS/Shunts: No atrial level shunt detected by color flow Doppler.   _____________   History of Present Illness     Daniel Bautista is a 62 y.o. male with reported PMH who presented to [facility] with chest pain. Found to have STEMI and transferred to [facility].   Mr. Surges presented to [facility] emergency room at roughly 3:40 PM the day of admission with complaints of chest pain. He had no known medical history. He stated he just finished painting the walls. He started having mid-sternum chest pain around 245pm. He was diaphoretic per observation of his wife. He  denied nausea, vomiting, SOB, orthopnea, leg edema.  He had 8/10 pain upon arrival to ER, received ASA, heparin bolus, as well as fentanyl. He has overall improved pain since. He smokes 1/2 PPD, seldom use of ETOH. He reported his father had heart attack in his 67s. He denied hx of HTN, DM, CVA, CKD. He denied any known allergy. He denied any hx of major bleeding or recent bleeding.    EKG in the field showed sinus rhythm 67 bpm, acute ST elevations in anterior as well as inferior leads. Patient was transferred to [facility] for emergent cardiac cath now.  Additional diagnostic lab at ER revealed hyponatremia 131, glucose 339, creatinine 1.14, GFR >60.  High sensitive troponin 4.  LDL 169, triglyceride 205, HDL 41.  CBC was leukocytosis 11 800, hemoglobin elevated 17.1.    Hospital Course     STEMI -- Presented with acute onset of chest pain to [facility], EKG showed anterior/inferior ST elevation.  He was transferred to [facility] underwent cardiac catheterization noted above with PCI/DES x 2 to RCA.  Does have residual disease in first OM of 90% and 80% in D1 to be managed medically.  No recurrent chest pain.  Recommendations for DAPT with aspirin/Brilinta for 1 year.  Seen by cardiac rehab. -- Continue aspirin, Brilinta, metoprolol XL 50mg  daily, atorvastatin, spironolactone 12.5mg  daily, Entresto 24-26 mg twice daily  HFrEF ICM -- Echocardiogram showed LVEF of 35 to 40%, global hypokinesis, diastolic dysfunction, mildly reduced RV with mild enlargement, no significant valvular disease. -- GDMT: Metoprolol XL 50 mg daily, spironolactone 12.5 mg daily, Entresto 24-26 mg twice daily  Diabetes -- Hemoglobin A1c 11.7 -- Seen by diabetes educator while inpatient -- Plan for new start on Semglee 12 units daily, metformin 500 mg daily at discharge -- Would add SGLT2 at follow-up visit -- ordered for glucose meter and supplies at DC  Hyperlipidemia -- LDL 154 -- Continue atorvastatin 80 mg daily -- Will need  LFT/FLP in 8 weeks  Patient seen by Dr. Irish Lack and deemed stable for discharge home. Follow up arranged in the office. Medications sent to [place] prior to DC.  Did the patient have an acute coronary syndrome (MI, NSTEMI, STEMI, etc) this admission?:  Yes                               AHA/ACC Clinical Performance & Quality Measures: Aspirin prescribed? - Yes ADP Receptor Inhibitor (Plavix/Clopidogrel, Brilinta/Ticagrelor or Effient/Prasugrel) prescribed (includes medically managed patients)? - Yes Beta Blocker prescribed? - Yes High Intensity Statin (Lipitor 40-80mg  or Crestor 20-40mg ) prescribed? - Yes EF assessed during THIS hospitalization? - Yes For EF <40%, was ACEI/ARB prescribed? - Yes For EF <40%, Aldosterone Antagonist (Spironolactone or Eplerenone) prescribed? - Yes Cardiac Rehab Phase II ordered (  including medically managed patients)? - Yes       The patient will be scheduled for a TOC follow up appointment in 10-14 days.  A message has been sent to the [facility] and Scheduling Pool at the office where the patient should be seen for follow up.  _____________  Discharge Vitals Blood pressure 133/77, pulse 77, temperature 98.4 F (36.9 C), temperature source Oral, resp. rate 18, height 5\' 8"  (1.727 m), weight 77.4 kg, SpO2 92 %.  Filed Weights   11/03/22 1403 11/04/22 0500 11/05/22 0536  Weight: 80.6 kg 78.6 kg 77.4 kg    Labs & Radiologic Studies    CBC Recent Labs    11/02/22 1552 11/02/22 1943 11/03/22 0028 11/04/22 0205 11/05/22 0251  WBC 11.8*   < > 12.6* 11.8* 11.1*  NEUTROABS 6.5  --  9.0*  --   --   HGB 17.1*   < > 16.1 15.8 15.7  HCT 50.3   < > 46.1 45.0 45.3  MCV 82.5   < > 80.3 79.8* 80.3  PLT 279   < > 247 224 241   < > = values in this interval not displayed.   Basic Metabolic Panel Recent Labs    11/02/22 1552 11/02/22 1943 11/03/22 0028 11/04/22 0205  NA 131*  --  131* 135  K 3.8  --  4.5 3.8  CL 96*  --  101 100  CO2 24  --  22  25  GLUCOSE 339*  --  285* 182*  BUN 14  --  14 15  CREATININE 1.14   < > 1.03 0.96  CALCIUM 9.2  --  9.0 9.5  MG 2.2  --   --   --    < > = values in this interval not displayed.   Liver Function Tests Recent Labs    11/02/22 1552  AST 22  ALT 26  ALKPHOS 77  BILITOT 0.8  PROT 7.6  ALBUMIN 4.1   No results for input(s): "LIPASE", "AMYLASE" in the last 72 hours. High Sensitivity Troponin:   Recent Labs  Lab 11/02/22 1552 11/02/22 1943  TROPONINIHS 4 7,810*    BNP Invalid input(s): "POCBNP" D-Dimer No results for input(s): "DDIMER" in the last 72 hours. Hemoglobin A1C Recent Labs    11/03/22 0028  HGBA1C 11.7*   Fasting Lipid Panel Recent Labs    11/03/22 0028  CHOL 216*  HDL 39*  LDLCALC 154*  TRIG 116  CHOLHDL 5.5   Thyroid Function Tests Recent Labs    11/02/22 1943  TSH 0.729   _____________  ECHOCARDIOGRAM COMPLETE  Result Date: 11/03/2022    ECHOCARDIOGRAM REPORT   Patient Name:   Daniel Bautista Date of Exam: 11/03/2022 Medical Rec #:  [id number]         Height:       68.0 in Accession #:    [id number]        Weight:       175.9 lb Date of Birth:  07/29/1961         BSA:          1.935 m Patient Age:    17 years          BP:           113/76 mmHg Patient Gender: M                 HR:           62 bpm. Exam  Location:  Inpatient Procedure: 2D Echo, Cardiac Doppler, Color Doppler and Intracardiac            Opacification Agent Indications:    R07.9* Chest pain, unspecified  History:        Patient has no prior history of Echocardiogram examinations.                 Signs/Symptoms:Chest Pain.  Sonographer:    Rolla Etienne Referring Phys: Margie Billet IMPRESSIONS  1. Left ventricular ejection fraction, by estimation, is 35 to 40%. The left ventricle has moderately decreased function. The left ventricle demonstrates global hypokinesis. Left ventricular diastolic parameters are consistent with Grade II diastolic dysfunction (pseudonormalization). There is the  interventricular septum is flattened in diastole ('D' shaped left ventricle), consistent with right ventricular volume overload.  2. Right ventricular systolic function is mildly reduced. The right ventricular size is mildly enlarged. Tricuspid regurgitation signal is inadequate for assessing PA pressure.  3. The mitral valve is normal in structure. No evidence of mitral valve regurgitation. No evidence of mitral stenosis.  4. The aortic valve is tricuspid. There is mild calcification of the aortic valve. Aortic valve regurgitation is not visualized. No aortic stenosis is present.  5. The inferior vena cava is normal in size with <50% respiratory variability, suggesting right atrial pressure of 8 mmHg. FINDINGS  Left Ventricle: Left ventricular ejection fraction, by estimation, is 35 to 40%. The left ventricle has moderately decreased function. The left ventricle demonstrates global hypokinesis. Definity contrast agent was given IV to delineate the left ventricular endocardial borders. The left ventricular internal cavity size was normal in size. There is no left ventricular hypertrophy. The interventricular septum is flattened in diastole ('D' shaped left ventricle), consistent with right ventricular volume overload. Left ventricular diastolic parameters are consistent with Grade II diastolic dysfunction (pseudonormalization). Right Ventricle: The right ventricular size is mildly enlarged. No increase in right ventricular wall thickness. Right ventricular systolic function is mildly reduced. Tricuspid regurgitation signal is inadequate for assessing PA pressure. Left Atrium: Left atrial size was normal in size. Right Atrium: Right atrial size was normal in size. Pericardium: There is no evidence of pericardial effusion. Mitral Valve: The mitral valve is normal in structure. There is mild calcification of the mitral valve leaflet(s). No evidence of mitral valve regurgitation. No evidence of mitral valve stenosis.  Tricuspid Valve: The tricuspid valve is normal in structure. Tricuspid valve regurgitation is not demonstrated. Aortic Valve: The aortic valve is tricuspid. There is mild calcification of the aortic valve. Aortic valve regurgitation is not visualized. No aortic stenosis is present. Aortic valve mean gradient measures 3.0 mmHg. Aortic valve peak gradient measures 5.0 mmHg. Aortic valve area, by VTI measures 2.35 cm. Pulmonic Valve: The pulmonic valve was normal in structure. Pulmonic valve regurgitation is trivial. Aorta: The aortic root is normal in size and structure. Venous: The inferior vena cava is normal in size with less than 50% respiratory variability, suggesting right atrial pressure of 8 mmHg. IAS/Shunts: No atrial level shunt detected by color flow Doppler.  LEFT VENTRICLE PLAX 2D LVIDd:         3.70 cm     Diastology LVIDs:         2.50 cm     LV e' medial:    7.51 cm/s LV PW:         1.10 cm     LV E/e' medial:  9.4 LV IVS:        1.10 cm  LV e' lateral:   9.14 cm/s LVOT diam:     2.00 cm     LV E/e' lateral: 7.7 LV SV:         54 LV SV Index:   28 LVOT Area:     3.14 cm  LV Volumes (MOD) LV vol d, MOD A2C: 64.4 ml LV vol d, MOD A4C: 77.0 ml LV vol s, MOD A2C: 30.6 ml LV vol s, MOD A4C: 48.5 ml LV SV MOD A2C:     33.8 ml LV SV MOD A4C:     77.0 ml LV SV MOD BP:      34.4 ml RIGHT VENTRICLE RV Basal diam:  3.20 cm RV Mid diam:    3.00 cm RV S prime:     10.00 cm/s TAPSE (M-mode): 1.6 cm LEFT ATRIUM             Index        RIGHT ATRIUM           Index LA diam:        3.20 cm 1.65 cm/m   RA Area:     13.00 cm LA Vol (A2C):   22.3 ml 11.52 ml/m  RA Volume:   30.30 ml  15.66 ml/m LA Vol (A4C):   20.8 ml 10.75 ml/m LA Biplane Vol: 23.4 ml 12.09 ml/m  AORTIC VALVE AV Area (Vmax):    2.39 cm AV Area (Vmean):   2.16 cm AV Area (VTI):     2.35 cm AV Vmax:           111.50 cm/s AV Vmean:          76.200 cm/s AV VTI:            0.230 m AV Peak Grad:      5.0 mmHg AV Mean Grad:      3.0 mmHg LVOT  Vmax:         84.90 cm/s LVOT Vmean:        52.400 cm/s LVOT VTI:          0.172 m LVOT/AV VTI ratio: 0.75 MITRAL VALVE MV Area (PHT): 3.89 cm    SHUNTS MV Decel Time: 195 msec    Systemic VTI:  0.17 m MV E velocity: 70.70 cm/s  Systemic Diam: 2.00 cm MV A velocity: 66.00 cm/s MV E/A ratio:  1.07 Dalton McleanMD Electronically signed by Franki Monte Signature Date/Time: 11/03/2022/1:48:57 PM    Final    CARDIAC CATHETERIZATION  Result Date: 11/02/2022   Lesion 1a: Prox RCA lesion is 55% stenosed.  Lestion 1b (CULPIT): Prox RCA to Dist RCA lesion is 100% stenosed with 100% stenosed side branch in Acute Mrg.   A drug-eluting stent was successfully placed covering the majority of the Culprit Lesion using a SYNERGY XD 2.50X48 -> deployed to 2.6 mm with the middle 1/3 postdilated to 2.8 mm, and proximal 1/3 postdilated to 3.1 mm.   A drug-eluting stent was successfully placed covering the proximal end of the initial stent and Lesion 1 a using a SYNERGY XD 2.75X12.  Postdilated to 3.1 mm.   Balloon angioplasty was performed on the acute marginal side branch using a BALLN EMERGE MR 2.5X12.   Post intervention, there is a 0% residual stenosis throughout the stented segment..   Post intervention, the Acute Margina side branch was reduced to 0% residual stenosis.   ------ Dist RCA lesion is 65% stenosed with 60% stenosed side branch in RPDA.   1st Diag lesion  is 80% stenosed. .   1st Mrg lesion is 90% stenosed   --------------------------------------------------------   The left ventricular systolic function is normal.   LV end diastolic pressure is normal.   The left ventricular ejection fraction is 50-55% by visual estimate.   There is no aortic valve stenosis. POST-OPERATIVE DIAGNOSIS:  Culprit lesion: Heavily thrombotic ulcerated and calcified proximal to mid 100% occlusion down around the crux after an RV marginal branch.  (After angioplasty and stenting there is a long area of the distal RCA into the PDA which is  about 65% stenosed that improved some with nitroglycerin-plan will be to treat this medically and reassess so as not to pass stents through a brand-new stent) Successful DES PCI using Aggrastat and extensive balloon dilation from proximal to near distal RCA.  The extensive lesion was covered using overlapping Synergy DES stents (2.5 mm x 48 mm distal and proximally overlapped with a 2.75 mm x 12 mm stent) the stented segment was postdilated to 3.1 mm proximally down to 2.8 mm in the mid mid stent with the distal segment being postdilated up to 2.6 mm. TIMI-3 flow reduced to TIMI 0 flow.  Inferior ST elevations resolved on EKG however anterior elevations persist, despite no lesion to explain it. No LAD disease with mid D1 80% (small-moderate caliber vessel-borderline PCI target)-unable to explain anterior ST elevations LCx was into the posterior groove to be codominant with 3 posterolateral branches after giving off OM1 that has a proximal 90% focal hinge lesion.  LPL 1 has a ostial 50% stenosis Relatively preserved LVEF with no obvious wall motion abnormality.  Recommend 2D echo for better evaluation.  LVEDP was 14 mmHg. PLAN OF CARE: Admit to inpatient  -> run Aggrastat for 6 hours.  DAPT for minimum 1 year. Check 2D Echo.  Check FLP, A1c High-dose atorvastatin, metoprolol 25 mg twice daily Would ask ICU colleagues to review the angiographic films for the diagonal and OM branches to see if there amenable to PCI.  In my opinion there may be too small for PCI.  Can be reassessed with outpatient stress test evaluation which would also allow [place] to assess the distal RCA disease. Smokes cessation counseling.  Cardiac Rehab Phase 1 and Phase 2 Consult Placed. Glenetta Hew, MD  DG Chest Port 1 View  Result Date: 11/02/2022 CLINICAL DATA:  Chest pain. EXAM: PORTABLE CHEST 1 VIEW COMPARISON:  Chest radiograph dated 02/19/2009. FINDINGS: The heart size and mediastinal contours are within normal limits. Both lungs are  clear. The visualized skeletal structures are unremarkable. IMPRESSION: No active disease. Electronically Signed   By: Anner Crete M.D.   On: 11/02/2022 18:48   Disposition   Pt is being discharged home today in good condition.  Follow-up Plans & Appointments     Follow-up Information     [facility] [place]. Go in 18 day(s).   Specialty: Cardiology Why: Hospital follow up 11/22/2022 @ 10 am PLEASE bring a current medication list to appointment FREE valet parking, Entrance C, off [employer] information: [address] [id number] [place]        Finis Bud, NP Follow up on 11/12/2022.   Specialty: Cardiology Why: at 8:30am for your follow up appt Contact information: [place] [id number] [phone]                Discharge Instructions     Amb Referral to Cardiac  Rehabilitation   Complete by: As directed    Diagnosis:  Coronary Stents STEMI     After initial evaluation and assessments completed: Virtual Based Care may be provided alone or in conjunction with Phase 2 Cardiac Rehab based on patient barriers.: Yes   Intensive Cardiac Rehabilitation (ICR) [place] location only OR Traditional Cardiac Rehabilitation (TCR) *If criteria for ICR are not met will enroll in TCR [facility] only): Yes   Call MD for:  difficulty breathing, headache or visual disturbances   Complete by: As directed    Call MD for:  persistant dizziness or light-headedness   Complete by: As directed    Call MD for:  redness, tenderness, or signs of infection (pain, swelling, redness, odor or green/yellow discharge around incision site)   Complete by: As directed    Diet - low sodium heart healthy   Complete by: As directed    Discharge instructions   Complete by: As directed    Radial Site Care Refer to this sheet in the next few weeks. These instructions provide you with  information on caring for yourself after your procedure. Your caregiver may also give you more specific instructions. Your treatment has been planned according to current medical practices, but problems sometimes occur. Call your caregiver if you have any problems or questions after your procedure. HOME CARE INSTRUCTIONS You may shower the day after the procedure. Remove the bandage (dressing) and gently wash the site with plain soap and water. Gently pat the site dry.  Do not apply powder or lotion to the site.  Do not submerge the affected site in water for 3 to 5 days.  Inspect the site at least twice daily.  Do not flex or bend the affected arm for 24 hours.  No lifting over 5 pounds (2.3 kg) for 5 days after your procedure.  Do not drive home if you are discharged the same day of the procedure. Have someone else drive you.  You may drive 24 hours after the procedure unless otherwise instructed by your caregiver.  What to expect: Any bruising will usually fade within 1 to 2 weeks.  Blood that collects in the tissue (hematoma) may be painful to the touch. It should usually decrease in size and tenderness within 1 to 2 weeks.  SEEK IMMEDIATE MEDICAL CARE IF: You have unusual pain at the radial site.  You have redness, warmth, swelling, or pain at the radial site.  You have drainage (other than a small amount of blood on the dressing).  You have chills.  You have a fever or persistent symptoms for more than 72 hours.  You have a fever and your symptoms suddenly get worse.  Your arm becomes pale, cool, tingly, or numb.  You have heavy bleeding from the site. Hold pressure on the site.   PLEASE DO NOT MISS ANY DOSES OF YOUR BRILINTA!!!!! Also keep a log of you blood pressures and bring back to your follow up appt. Please call the office with any questions.   Patients taking blood thinners should generally stay away from medicines like ibuprofen, Advil, Motrin, naproxen, and Aleve due to risk  of stomach bleeding. You may take Tylenol as directed or talk to your primary doctor about alternatives.  PLEASE ENSURE THAT YOU DO NOT RUN OUT OF YOUR BRILINTA. This medication is very important to remain on for at least one year. IF you have issues obtaining this medication due to cost please CALL the office 3-5 business days prior to  running out in order to prevent missing doses of this medication.   Increase activity slowly   Complete by: As directed    No wound care   Complete by: As directed       Discharge Medications   Allergies as of 11/05/2022   Not on File      Medication List     TAKE these medications    Accu-Chek Guide test strip Generic drug: glucose blood Use as directed   Accu-Chek Guide w/Device Kit Use up to four times daily as directed   Accu-Chek Softclix Lancets lancets Use as directed   Aspirin Low Dose 81 MG tablet Generic drug: aspirin EC Take 1 tablet (81 mg total) by mouth daily. Swallow whole.   atorvastatin 80 MG tablet Commonly known as: LIPITOR Take 1 tablet (80 mg total) by mouth daily.   Entresto 24-26 MG Generic drug: sacubitril-valsartan Take 1 tablet by mouth 2 (two) times daily.   insulin glargine-yfgn 100 UNIT/ML Pen Commonly known as: SEMGLEE Inject 12 Units into the skin daily.   metFORMIN 500 MG tablet Commonly known as: GLUCOPHAGE Take 1 tablet (500 mg total) by mouth daily with breakfast.   metoprolol succinate 50 MG 24 hr tablet Commonly known as: Toprol XL Take 1 tablet (50 mg total) by mouth daily. Take with or immediately following a meal.   nitroGLYCERIN 0.4 MG SL tablet Commonly known as: NITROSTAT Place 1 tablet (0.4 mg total) under the tongue every 5 (five) minutes x 3 doses as needed for chest pain.   spironolactone 25 MG tablet Commonly known as: ALDACTONE Take 0.5 tablets (12.5 mg total) by mouth daily.   TechLite Pen Needles 32G X 4 MM Misc Generic drug: Insulin Pen Needle Use daily   ticagrelor  90 MG Tabs tablet Commonly known as: BRILINTA Take 1 tablet (90 mg total) by mouth 2 (two) times daily.       Outstanding Labs/Studies   BMET at follow up FLP/LFTs in 8 weeks   Duration of Discharge Encounter   Greater than 30 minutes including physician time.  Signed, Reino Bellis, NP 11/05/2022, 12:40 PM  I have examined the patient and reviewed assessment and plan and discussed with patient.  Agree with above as stated.    GEN: Well nourished, well developed, in no acute distress  HEENT: normal  Neck: no JVD, carotid bruits, or masses Cardiac: RRR; no murmurs, rubs, or gallops,no edema  Respiratory:  clear to auscultation bilaterally, normal work of breathing GI: soft, nontender, nondistended,  MS: no deformity or atrophy ; right radial site without hematoma; 2+ right radial pulse Skin: warm and dry, no rash Neuro:  Strength and sensation are intact Psych: euthymic mood, full affect  S/p STEMI. Continue DAPT with high dose statin, Entresto, aldactone.  Needs more aggressive DM management.  Insulin for now which will hopefully be temporary and he can be managed on oral meds. I stressed the importance of DM management to prevent future cardiac events. Medical management of residual branch vessel CAD.    Jettie Booze, MD

## 2022-11-12 ENCOUNTER — Ambulatory Visit: Payer: BC Managed Care – PPO | Admitting: Nurse Practitioner

## 2022-11-15 ENCOUNTER — Other Ambulatory Visit (HOSPITAL_COMMUNITY): Payer: Self-pay

## 2022-11-15 ENCOUNTER — Ambulatory Visit: Payer: BC Managed Care – PPO | Attending: Nurse Practitioner | Admitting: Nurse Practitioner

## 2022-11-15 ENCOUNTER — Encounter: Payer: Self-pay | Admitting: Nurse Practitioner

## 2022-11-15 VITALS — BP 116/82 | HR 59 | Ht 68.0 in | Wt 177.0 lb

## 2022-11-15 DIAGNOSIS — I255 Ischemic cardiomyopathy: Secondary | ICD-10-CM

## 2022-11-15 DIAGNOSIS — I5020 Unspecified systolic (congestive) heart failure: Secondary | ICD-10-CM | POA: Diagnosis not present

## 2022-11-15 DIAGNOSIS — I2510 Atherosclerotic heart disease of native coronary artery without angina pectoris: Secondary | ICD-10-CM

## 2022-11-15 DIAGNOSIS — E785 Hyperlipidemia, unspecified: Secondary | ICD-10-CM

## 2022-11-15 DIAGNOSIS — Z79899 Other long term (current) drug therapy: Secondary | ICD-10-CM

## 2022-11-15 DIAGNOSIS — I252 Old myocardial infarction: Secondary | ICD-10-CM

## 2022-11-15 MED ORDER — SPIRONOLACTONE 25 MG PO TABS: 12.5000 mg | ORAL_TABLET | Freq: Every day | ORAL | 1 refills | Status: DC

## 2022-11-15 MED ORDER — TICAGRELOR 90 MG PO TABS: 90.0000 mg | ORAL_TABLET | Freq: Two times a day (BID) | ORAL | 1 refills | Status: DC

## 2022-11-15 NOTE — Patient Instructions (Addendum)
Medication Instructions:  Your physician recommends that you continue on your current medications as directed. Please refer to the Current Medication list given to you today.   Labwork: Lipid Panel and LFT (due in 2 months at [facility])  Testing/Procedures: Your physician has requested that you have an echocardiogram. Echocardiography is a painless test that uses sound waves to create images of your heart. It provides your doctor with information about the size and shape of your heart and how well your heart's chambers and valves are working. This procedure takes approximately one hour. There are no restrictions for this procedure. Please do NOT wear cologne, perfume, aftershave, or lotions (deodorant is allowed). Please arrive 15 minutes prior to your appointment time.   Follow-Up:  Your physician recommends that you schedule a follow-up appointment in: 4 months  Any Other Special Instructions Will Be Listed Below (If Applicable).  If you need a refill on your cardiac medications before your next appointment, please call your pharmacy.Marland Kitchen

## 2022-11-15 NOTE — Progress Notes (Addendum)
Office Visit    Patient Name: Daniel Bautista Date of Encounter: 11/15/2022  PCP:  Kathyrn Lass   [place]  Cardiologist:  Glenetta Hew, MD  Advanced Practice Provider:  No care team member to display Electrophysiologist:  None   Chief Complaint    Daniel Bautista is a 62 y.o. male with a hx of CAD, s/p STEMI and s/p PCI/DES x 2 to RCA, HFrEF/ICM, T2DM, former tobacco abuse, and HLD, who presents today for STEMI follow-up.    Past Medical History    Past Medical History:  Diagnosis Date   Acute ST elevation myocardial infarction (STEMI) of inferior wall 11/02/2022   Coronary artery disease involving native coronary artery of native heart with unstable angina pectoris 11/03/2022   04/04/2023     Culprit lesion: Heavily thrombotic ulcerated /calcified prox-mid RCA 100% occlusion around crux after an RVM branch.  (After angioplasty and stenting there is a long area of the distal RCA into the PDA which is about 65% stenosed => improved some w/ NTG IC, so plan MedRx; OM1 90%, D1 80% (Med Rx).   Current smoker 11/03/2022   Hyperlipidemia associated with type 2 diabetes mellitus 11/03/2022   Ischemic cardiomyopathy 11/03/2022   TTE 11/03/2022-s/p inferior STEMI: EF 35 to 40%.  Moderate reduced function.  Global HK.  GR 2 DD.  D-shaped IVS consistent with RV volume overload.  RV function mildly reduced, mildly enlarged.   Presence of drug coated stent in right coronary artery 11/03/2022   Overlapping Synergr DES 2.5 x 48 & 2.75 x 12 - tapered post-dilation 3.1-2.8-2.6 mm (prox-~distal RCA) in setting of Inf STEMI -100% prox RCA   Type 2 diabetes mellitus with complication, without long-term current use of insulin 11/03/2022     Allergies  No Known Allergies  History of Present Illness    Daniel Bautista is a 62 y.o. male with a PMH as mentioned above.  Presented to [facility] 10/2022, found to have STEMI, transferred to [facility] to undergo cardiac cath - report noted  below. Received PCI/DES x 2 to RCA, found to have residual dx to OM1 of 90%, 80% in D1 to be medically managed. Recommended to continue DAPT with ASA/Brilinta x 1 year. TTE revealed ICM with EF of 35-40%, global hypokinesis, DD, mildly reduced RV with mild enlargement of RV. Told him to follow-up with outpatient cardiology.   Today he presents for follow-up. He states he is doing well. Denies any chest pain, shortness of breath, palpitations, syncope, presyncope, dizziness, orthopnea, PND, swelling or significant weight changes, acute bleeding, or claudication. Tolerating his medications well. Has quit smoking, denies any alcohol use or illicit drug use.   EKGs/Labs/Other Studies Reviewed:   The following studies were reviewed today:   EKG:  EKG is not ordered today.    TTE 10/2022: EF 35-40%, grade 2 DD.   LHC 10/2022:   Lesion 1a: Prox RCA lesion is 55% stenosed.  Lestion 1b (CULPIT): Prox RCA to Dist RCA lesion is 100% stenosed with 100% stenosed side branch in Acute Mrg.   A drug-eluting stent was successfully placed covering the majority of the Culprit Lesion using a SYNERGY XD 2.50X48 -> deployed to 2.6 mm with the middle 1/3 postdilated to 2.8 mm, and proximal 1/3 postdilated to 3.1 mm.   A drug-eluting stent was successfully placed covering the proximal end of the initial stent and Lesion 1 a using a SYNERGY XD 2.75X12.  Postdilated to 3.1 mm.  Balloon angioplasty was performed on the acute marginal side branch using a BALLN EMERGE MR 2.5X12.   Post intervention, there is a 0% residual stenosis throughout the stented segment..   Post intervention, the Acute Margina side branch was reduced to 0% residual stenosis.   ------ Dist RCA lesion is 65% stenosed with 60% stenosed side branch in RPDA.   1st Diag lesion is 80% stenosed. .   1st Mrg lesion is 90% stenosed   --------------------------------------------------------   The left ventricular systolic function is normal.   LV end  diastolic pressure is normal.   The left ventricular ejection fraction is 50-55% by visual estimate.   There is no aortic valve stenosis.   POST-OPERATIVE DIAGNOSIS:   Culprit lesion: Heavily thrombotic ulcerated and calcified proximal to mid 100% occlusion down around the crux after an RV marginal branch.  (After angioplasty and stenting there is a long area of the distal RCA into the PDA which is about 65% stenosed that improved some with nitroglycerin-plan will be to treat this medically and reassess so as not to pass stents through a brand-new stent) Successful DES PCI using Aggrastat and extensive balloon dilation from proximal to near distal RCA.  The extensive lesion was covered using overlapping Synergy DES stents (2.5 mm x 48 mm distal and proximally overlapped with a 2.75 mm x 12 mm stent) the stented segment was postdilated to 3.1 mm proximally down to 2.8 mm in the mid mid stent with the distal segment being postdilated up to 2.6 mm. TIMI-3 flow reduced to TIMI 0 flow.  Inferior ST elevations resolved on EKG however anterior elevations persist, despite no lesion to explain it. No LAD disease with mid D1 80% (small-moderate caliber vessel-borderline PCI target)-unable to explain anterior ST elevations LCx was into the posterior groove to be codominant with 3 posterolateral branches after giving off OM1 that has a proximal 90% focal hinge lesion.  LPL 1 has a ostial 50% stenosis Relatively preserved LVEF with no obvious wall motion abnormality.  Recommend 2D echo for better evaluation.  LVEDP was 14 mmHg.    PLAN OF CARE: Admit to inpatient  -> run Aggrastat for 6 hours.  DAPT for minimum 1 year. Check 2D Echo.   Check FLP, A1c High-dose atorvastatin, metoprolol 25 mg twice daily Would ask ICU colleagues to review the angiographic films for the diagonal and OM branches to see if there amenable to PCI.  In my opinion there may be too small for PCI.  Can be reassessed with outpatient stress  test evaluation which would also allow [place] to assess the distal RCA disease. Smokes cessation counseling.  Cardiac Rehab Phase 1 and Phase 2 Consult Placed.      Recent Labs: 11/02/2022: ALT 26; Magnesium 2.2; TSH 0.729 11/04/2022: BUN 15; Creatinine, Ser 0.96; Potassium 3.8; Sodium 135 11/05/2022: Hemoglobin 15.7; Platelets 241  Recent Lipid Panel    Component Value Date/Time   CHOL 216 (H) 11/03/2022 0028   TRIG 116 11/03/2022 0028   HDL 39 (L) 11/03/2022 0028   CHOLHDL 5.5 11/03/2022 0028   VLDL 23 11/03/2022 0028   LDLCALC 154 (H) 11/03/2022 0028    Home Medications   Current Meds  Medication Sig   Accu-Chek Softclix Lancets lancets Use as directed   aspirin EC 81 MG tablet Take 1 tablet (81 mg total) by mouth daily. Swallow whole.   atorvastatin (LIPITOR) 80 MG tablet Take 1 tablet (80 mg total) by mouth daily.   Blood Glucose Monitoring Suppl (  ACCU-CHEK GUIDE) w/Device KIT Use up to four times daily as directed   glucose blood test strip Use as directed   insulin glargine-yfgn (SEMGLEE) 100 UNIT/ML Pen Inject 12 Units into the skin daily.   Insulin Pen Needle 32G X 4 MM MISC Use daily   metFORMIN (GLUCOPHAGE) 500 MG tablet Take 1 tablet (500 mg total) by mouth daily with breakfast.   metoprolol succinate (TOPROL XL) 50 MG 24 hr tablet Take 1 tablet (50 mg total) by mouth daily. Take with or immediately following a meal.   nitroGLYCERIN (NITROSTAT) 0.4 MG SL tablet Place 1 tablet (0.4 mg total) under the tongue every 5 (five) minutes x 3 doses as needed for chest pain.   sacubitril-valsartan (ENTRESTO) 24-26 MG Take 1 tablet by mouth 2 (two) times daily.   spironolactone (ALDACTONE) 25 MG tablet Take 0.5 tablets (12.5 mg total) by mouth daily.    ticagrelor (BRILINTA) 90 MG TABS tablet Take 1 tablet (90 mg total) by mouth 2 (two) times daily.     Review of Systems    All other systems reviewed and are otherwise negative except as noted above.  Physical Exam    VS:  BP  116/82 (BP Location: Left Arm, Patient Position: Sitting, Cuff Size: Normal)   Pulse (!) 59   Ht 5\' 8"  (1.727 m)   Wt 177 lb (80.3 kg)   SpO2 98%   BMI 26.91 kg/m  , BMI Body mass index is 26.91 kg/m.  Wt Readings from Last 3 Encounters:  11/15/22 177 lb (80.3 kg)  11/05/22 170 lb 10.2 oz (77.4 kg)     GEN: Well nourished, well developed, in no acute distress. HEENT: normal. Neck: Supple, no JVD, carotid bruits, or masses. Cardiac: S1/S2, RRR, no murmurs, rubs, or gallops. No clubbing, cyanosis, edema.  Radials/PT 2+ and equal bilaterally.  Respiratory:  Respirations regular and unlabored, clear to auscultation bilaterally. MS: No deformity or atrophy. Skin: Warm and dry, no rash. Neuro:  Strength and sensation are intact. Psych: Normal affect.  Assessment & Plan    CAD, s/p STEMI and s/p PCI/DES x 2 to RCA LHC also revealed residual dx to OM1 of 90%, 80% in D1 to be medically managed. Continue ASA, Brilinta, atorvastatin, Toprol XL, and NG PRN. Will refill Brilinta. No complications at right radial site, healing well. Denies any chest pain. Heart healthy diet and regular cardiovascular exercise as tolerated encouraged. Okay to start cardiac rehab.    Cardiac Rehabilitation Eligibility Assessment  The patient is ready to start cardiac rehabilitation from a cardiac standpoint.   2. ICM/HFrEF TTE revealed EF 35-40%, grade 2 DD. Euvolemic and well compensated on exam. Continue current GDMT. No room to uptitrate GDMT at this time. Low sodium diet, fluid restriction <2L, and daily weights encouraged. Educated to contact our office for weight gain of 2 lbs overnight or 5 lbs in one week. Will update TTE in 3 months. Will refill Aldactone per his request. Follow-up with HF clinic as scheduled, and recommend obtaining BMET at that follow-up.  3. HLD Recently started on Lipitor prior to d/c. Tolerating well. Will check FLP and LFT in 2 months per protocol. Continue atorvastatin. Heart  healthy diet and regular cardiovascular exercise encouraged.   Disposition: Requesting to be seen in [place] for follow-up. Follow up with Dr. Dellia Cloud or APP in 4 months or sooner if anything changes.   Signed, Finis Bud, NP 11/15/2022, 9:48 AM [place]

## 2022-11-22 ENCOUNTER — Ambulatory Visit (HOSPITAL_COMMUNITY)
Admission: RE | Admit: 2022-11-22 | Discharge: 2022-11-22 | Disposition: A | Discharge status: Home / Self Care | Payer: BC Managed Care – PPO | Source: Ambulatory Visit | Attending: Cardiology | Admitting: Cardiology

## 2022-11-22 ENCOUNTER — Encounter (HOSPITAL_COMMUNITY): Payer: Self-pay

## 2022-11-22 VITALS — BP 110/76 | HR 76 | Wt 180.2 lb

## 2022-11-22 DIAGNOSIS — Z87891 Personal history of nicotine dependence: Secondary | ICD-10-CM | POA: Diagnosis not present

## 2022-11-22 DIAGNOSIS — I5020 Unspecified systolic (congestive) heart failure: Secondary | ICD-10-CM | POA: Diagnosis not present

## 2022-11-22 DIAGNOSIS — Z9861 Coronary angioplasty status: Secondary | ICD-10-CM

## 2022-11-22 DIAGNOSIS — I5022 Chronic systolic (congestive) heart failure: Secondary | ICD-10-CM | POA: Diagnosis not present

## 2022-11-22 DIAGNOSIS — I252 Old myocardial infarction: Secondary | ICD-10-CM | POA: Insufficient documentation

## 2022-11-22 DIAGNOSIS — I255 Ischemic cardiomyopathy: Secondary | ICD-10-CM | POA: Diagnosis not present

## 2022-11-22 DIAGNOSIS — Z7984 Long term (current) use of oral hypoglycemic drugs: Secondary | ICD-10-CM | POA: Insufficient documentation

## 2022-11-22 DIAGNOSIS — Z955 Presence of coronary angioplasty implant and graft: Secondary | ICD-10-CM | POA: Diagnosis not present

## 2022-11-22 DIAGNOSIS — Z7902 Long term (current) use of antithrombotics/antiplatelets: Secondary | ICD-10-CM | POA: Diagnosis not present

## 2022-11-22 DIAGNOSIS — E1165 Type 2 diabetes mellitus with hyperglycemia: Secondary | ICD-10-CM | POA: Diagnosis not present

## 2022-11-22 DIAGNOSIS — Z79899 Other long term (current) drug therapy: Secondary | ICD-10-CM | POA: Diagnosis not present

## 2022-11-22 DIAGNOSIS — E785 Hyperlipidemia, unspecified: Secondary | ICD-10-CM | POA: Insufficient documentation

## 2022-11-22 DIAGNOSIS — I2510 Atherosclerotic heart disease of native coronary artery without angina pectoris: Secondary | ICD-10-CM | POA: Diagnosis not present

## 2022-11-22 DIAGNOSIS — Z794 Long term (current) use of insulin: Secondary | ICD-10-CM | POA: Insufficient documentation

## 2022-11-22 LAB — BRAIN NATRIURETIC PEPTIDE: B Natriuretic Peptide: 35.6 pg/mL (ref 0.0–100.0)

## 2022-11-22 LAB — BASIC METABOLIC PANEL
Anion gap: 9 (ref 5–15)
BUN: 13 mg/dL (ref 8–23)
CO2: 25 mmol/L (ref 22–32)
Calcium: 9.5 mg/dL (ref 8.9–10.3)
Chloride: 101 mmol/L (ref 98–111)
Creatinine, Ser: 0.96 mg/dL (ref 0.61–1.24)
GFR, Estimated: >60 mL/min (ref >60)
Glucose, Bld: 170 mg/dL — ABNORMAL HIGH (ref 70–99)
Potassium: 4.4 mmol/L (ref 3.5–5.1)
Sodium: 135 mmol/L (ref 135–145)

## 2022-11-22 MED ORDER — DAPAGLIFLOZIN PROPANEDIOL 10 MG PO TABS: 10.0000 mg | ORAL_TABLET | Freq: Every day | ORAL | 11 refills | Status: DC

## 2022-11-22 MED ORDER — SPIRONOLACTONE 25 MG PO TABS: 25.0000 mg | ORAL_TABLET | Freq: Every day | ORAL | 3 refills | Status: DC

## 2022-11-22 NOTE — Progress Notes (Signed)
ReDS Vest / Clip - 11/22/22 1002       ReDS Vest / Clip   Station Marker C    Ruler Value 28    ReDS Value Range Moderate volume overload    ReDS Actual Value 38

## 2022-11-22 NOTE — Patient Instructions (Addendum)
EKG done today.  Reds Clip done today.   Labs done today. We will contact you only if your labs are abnormal.  START Farxiga 10mg  (1 tablet) by mouth daily.   No other medication changes were made. Please continue all current medications as prescribed.  Your physician recommends that you schedule a follow-up appointment in: 2 weeks with our Clinic Pharmacist, 6 weeks with our NP/PA Clinic and in 12 weeks with Dr. Shirlee Latch with an echo prior to your appointment all appointments here in our office.    Your physician has requested that you have an echocardiogram. Echocardiography is a painless test that uses sound waves to create images of your heart. It provides your doctor with information about the size and shape of your heart and how well your heart's chambers and valves are working. This procedure takes approximately one hour. There are no restrictions for this procedure. Please do NOT wear cologne, perfume, aftershave, or lotions (deodorant is allowed). Please arrive 15 minutes prior to your appointment time.  If you have any questions or concerns before your next appointment please send [place] a message through [place] or call our office at [phone].    TO LEAVE A MESSAGE FOR THE NURSE SELECT OPTION 2, PLEASE LEAVE A MESSAGE INCLUDING: YOUR NAME DATE OF BIRTH CALL BACK NUMBER REASON FOR CALL**this is important as we prioritize the call backs  YOU WILL RECEIVE A CALL BACK THE SAME DAY AS LONG AS YOU CALL BEFORE 4:00 PM   Do the following things EVERYDAY: Weigh yourself in the morning before breakfast. Write it down and keep it in a log. Take your medicines as prescribed Eat low salt foods--Limit salt (sodium) to 2000 mg per day.  Stay as active as you can everyday Limit all fluids for the day to less than 2 liters   At the [facility], you and your health needs are our priority. As part of our continuing mission to provide you with exceptional heart care, we have  created designated Provider Care Teams. These Care Teams include your primary Cardiologist (physician) and Advanced Practice Providers (APPs- Physician Assistants and Nurse Practitioners) who all work together to provide you with the care you need, when you need it.   You may see any of the following providers on your designated Care Team at your next follow up: Dr Arvilla Meres Dr Carron Curie, NP Robbie Lis, [place] Karle Plumber, PharmD   Please be sure to bring in all your medications bottles to every appointment.

## 2022-11-22 NOTE — Progress Notes (Addendum)
HEART & VASCULAR TRANSITION OF CARE CONSULT NOTE     Referring Physician: Dr. Eldridge Dace  Primary Care: Pcp, No Primary Cardiologist: Bryan Lemma, MD  HPI: Referred to clinic by Dr. Eldridge Dace for heart failure consultation.   62 y/o male smoker w/ h/o HLD, Type 2DM and newly diagnosed CAD and systolic heart failure/ ischemic CM.   Admitted 3/24 w/ acute inferior STEMI. Cath w/ 55% prox RCA, 100% thrombotic occluded mid RCA (culprit), s/p PCI +DES  to prox-mid RCA, residual 60% stenosis of distal RCA, 80% mid 1st diag (small vessel) and 70% prox 1st OM (small vessel) treated medically. LVEDP was 14 mmHg. Echo moderately reduced LVEF, 35-40%, GIIDD, interventricular septum flattened in diastole ('D' shaped left ventricle), consistent with right ventricular volume overload, RV systolic fx mildly reduced.   LDL 154, Lipoprotein a 213 Hgb A1c 11.7    Treated w/ ASA, Brilinta + atorvastatin. Entresto, Sprio and Toprol XL for GDMT. Was not discharged on loop diuretic. Referred to [facility] clinic. D/c wt 170 lb.   Presents today for post hospital f/u.  Here w/ wife. Reports doing fairly well. Denies CP. No significant resting dyspnea, denies orthopnea/PND. No LEE. Wt up 10 lb at 180 lb on our scales but he attributes some of this to clothing. Home Wts around 170-172 lb. ReDs mildly elevated at 38%. Reports full compliance w/ meds but ran out of spiro 2 days ago. Tolerating meds ok w/o side effects. Denies dizziness. Checking glucose levels at home, fasting levels ~180. Has quit smoking.      Cardiac Testing   Diagnostic Dominance: Co-dominant  Intervention      Echo: 11/03/2022   IMPRESSIONS     1. Left ventricular ejection fraction, by estimation, is 35 to 40%. The  left ventricle has moderately decreased function. The left ventricle  demonstrates global hypokinesis. Left ventricular diastolic parameters are  consistent with Grade II diastolic  dysfunction (pseudonormalization).  There is the interventricular septum is  flattened in diastole ('D' shaped left ventricle), consistent with right  ventricular volume overload.   2. Right ventricular systolic function is mildly reduced. The right  ventricular size is mildly enlarged. Tricuspid regurgitation signal is  inadequate for assessing PA pressure.   3. The mitral valve is normal in structure. No evidence of mitral valve  regurgitation. No evidence of mitral stenosis.   4. The aortic valve is tricuspid. There is mild calcification of the  aortic valve. Aortic valve regurgitation is not visualized. No aortic  stenosis is present.   5. The inferior vena cava is normal in size with <50% respiratory  variability, suggesting right atrial pressure of 8 mmHg.      Review of Systems: [y] = yes, [ ]  = no   General: Weight gain [ ] ; Weight loss [ ] ; Anorexia [ ] ; Fatigue [ ] ; Fever [ ] ; Chills [ ] ; Weakness [ ]   Cardiac: Chest pain/pressure [ ] ; Resting SOB [ ] ; Exertional SOB [ ] ; Orthopnea [ ] ; Pedal Edema [ ] ; Palpitations [ ] ; Syncope [ ] ; Presyncope [ ] ; Paroxysmal nocturnal dyspnea[ ]   Pulmonary: Cough [ ] ; Wheezing[ ] ; Hemoptysis[ ] ; Sputum [ ] ; Snoring [Y ]  GI: Vomiting[ ] ; Dysphagia[ ] ; Melena[ ] ; Hematochezia [ ] ; Heartburn[ ] ; Abdominal pain [ ] ; Constipation [ ] ; Diarrhea [ ] ; BRBPR [ ]   GU: Hematuria[ ] ; Dysuria [ ] ; Nocturia[ ]   Vascular: Pain in legs with walking [ ] ; Pain in feet with lying flat [ ] ; Non-healing sores [ ] ;  Stroke [ ] ; TIA [ ] ; Slurred speech [ ] ;  Neuro: Headaches[ ] ; Vertigo[ ] ; Seizures[ ] ; Paresthesias[ ] ;Blurred vision [ ] ; Diplopia [ ] ; Vision changes [ ]   Ortho/Skin: Arthritis [ ] ; Joint pain [ ] ; Muscle pain [ ] ; Joint swelling [ ] ; Back Pain [ ] ; Rash [ ]   Psych: Depression[ ] ; Anxiety[ ]   Heme: Bleeding problems [ ] ; Clotting disorders [ ] ; Anemia [ ]   Endocrine: Diabetes [ ] ; Thyroid dysfunction[ ]    Past Medical History:  Diagnosis Date   Acute ST elevation myocardial  infarction (STEMI) of inferior wall 11/02/2022   Coronary artery disease involving native coronary artery of native heart with unstable angina pectoris 11/03/2022   04/04/2023     Culprit lesion: Heavily thrombotic ulcerated /calcified prox-mid RCA 100% occlusion around crux after an RVM branch.  (After angioplasty and stenting there is a long area of the distal RCA into the PDA which is about 65% stenosed => improved some w/ NTG IC, so plan MedRx; OM1 90%, D1 80% (Med Rx).   Current smoker 11/03/2022   Hyperlipidemia associated with type 2 diabetes mellitus 11/03/2022   Ischemic cardiomyopathy 11/03/2022   TTE 11/03/2022-s/p inferior STEMI: EF 35 to 40%.  Moderate reduced function.  Global HK.  GR 2 DD.  D-shaped IVS consistent with RV volume overload.  RV function mildly reduced, mildly enlarged.   Presence of drug coated stent in right coronary artery 11/03/2022   Overlapping Synergr DES 2.5 x 48 & 2.75 x 12 - tapered post-dilation 3.1-2.8-2.6 mm (prox-~distal RCA) in setting of Inf STEMI -100% prox RCA   Type 2 diabetes mellitus with complication, without long-term current use of insulin 11/03/2022    Current Outpatient Medications  Medication Sig Dispense Refill   Accu-Chek Softclix Lancets lancets Use as directed 100 each 0   aspirin EC 81 MG tablet Take 1 tablet (81 mg total) by mouth daily. Swallow whole. 90 tablet 3   atorvastatin (LIPITOR) 80 MG tablet Take 1 tablet (80 mg total) by mouth daily. 90 tablet 3   Blood Glucose Monitoring Suppl (ACCU-CHEK GUIDE) w/Device KIT Use up to four times daily as directed 1 kit 0   glucose blood test strip Use as directed 100 each 0   insulin glargine-yfgn (SEMGLEE) 100 UNIT/ML Pen Inject 12 Units into the skin daily. 9 mL 3   Insulin Pen Needle 32G X 4 MM MISC Use daily 100 each 3   metFORMIN (GLUCOPHAGE) 500 MG tablet Take 1 tablet (500 mg total) by mouth daily with breakfast. 30 tablet 11   metoprolol succinate (TOPROL XL) 50 MG 24 hr tablet Take  1 tablet (50 mg total) by mouth daily. Take with or immediately following a meal. 90 tablet 3   nitroGLYCERIN (NITROSTAT) 0.4 MG SL tablet Place 1 tablet (0.4 mg total) under the tongue every 5 (five) minutes x 3 doses as needed for chest pain. 25 tablet 0   sacubitril-valsartan (ENTRESTO) 24-26 MG Take 1 tablet by mouth 2 (two) times daily. 60 tablet 11   spironolactone (ALDACTONE) 25 MG tablet Take 0.5 tablets (12.5 mg total) by mouth daily. 45 tablet 1   ticagrelor (BRILINTA) 90 MG TABS tablet Take 1 tablet (90 mg total) by mouth 2 (two) times daily. 180 tablet 1   No current facility-administered medications for this encounter.    No Known Allergies    Social History   Socioeconomic History   Marital status: Married    Spouse name: [name]   Number  of children: 3   Years of education: Not on file   Highest education level: High school graduate  Occupational History   Occupation: Retired  Tobacco Use   Smoking status: Former    Years: 45    Types: Cigarettes    Quit date: 11/02/2022    Years since quitting: 0.0    Passive exposure: Never   Smokeless tobacco: Never  Vaping Use   Vaping Use: Never used  Substance and Sexual Activity   Alcohol use: Yes    Alcohol/week: 12.0 standard drinks of alcohol    Types: 12 Cans of beer per week    Comment: 12 pack per month   Drug use: Never   Sexual activity: Not on file  Other Topics Concern   Not on file  Social History Narrative   Not on file   Social Determinants of Health   Financial Resource Strain: Low Risk  (11/04/2022)   Overall Financial Resource Strain (CARDIA)    Difficulty of Paying Living Expenses: Not hard at all  Food Insecurity: No Food Insecurity (11/04/2022)   Hunger Vital Sign    Worried About Running Out of Food in the Last Year: Never true    Ran Out of Food in the Last Year: Never true  Transportation Needs: No Transportation Needs (11/04/2022)   PRAPARE - Administrator, Civil ServiceTransportation    Lack of Transportation  (Medical): No    Lack of Transportation (Non-Medical): No  Physical Activity: Not on file  Stress: Not on file  Social Connections: Not on file  Intimate Partner Violence: Not on file     No family history on file.  Vitals:   11/22/22 1002  BP: 110/76  Pulse: 76  SpO2: 97%  Weight: 81.7 kg (180 lb 3.2 oz)    PHYSICAL EXAM: ReDs 38%  General:  Well appearing. No respiratory difficulty HEENT: normal Neck: supple. JVD 8 cm. Carotids 2+ bilat; no bruits. No lymphadenopathy or thryomegaly appreciated. Cor: PMI nondisplaced. Regular rate & rhythm. No rubs, gallops or murmurs. Lungs: clear Abdomen: soft, nontender, nondistended. No hepatosplenomegaly. No bruits or masses. Good bowel sounds. Extremities: no cyanosis, clubbing, rash, edema Neuro: alert & oriented x 3, cranial nerves grossly intact. moves all 4 extremities w/o difficulty. Affect pleasant.  ECG: NSR 60 bmp, inferior TWIs    ASSESSMENT & PLAN:  1. 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 Mild volume overload on exam. Wt up. ReDs 38% - Add Farxiga 10 mg daily  - Continue Spironolactone 25 mg daily  - Continue Entresto 24-26 mg bid. BP too soft for titration  - Continue Toprol XL 50 mg daily.  - Does not need loop diuretic currently  - Continue GDMT titration until fully optimized followed by repeat echo in 3 months. If EF not improving, would recommend cMRI to assess for possible nonischemic component. Will refer to the AHF clinic to be followed   2. CAD - s/p inferior STEMI w/ stent to prox-mid RCA, + residual disease in distal RCA, 1st diag and 1st OM (medical management, vessels too small for stenting) - stable w/o CP  - ASA + Brilinta - high intensity statin  - on ? blocker - has quit smoking   3. Type 2DM  -  Poorly controlled, Hgb A1c 11.7 -  on insulin + Metformin  - fasting home glucose levels ~180s - adding Farxiga 10 mg  - needs to f/u w/ PCP   4. HLD, LDL Goal < 55  - LDL 154, Lipoprotein a 213 - atorva 80 mg nightly  - needs repeat FLP and HFT in 6 wks. If LDL not at goal, would recommend referral to lipid clinic for PCSK9i   5. Tobacco Abuse  - has quit smoking  - congratulated on efforts     Referred to HFSW (PCP, Medications, Transportation, ETOH Abuse, Drug Abuse, Insurance, Financial ): No  Refer to Pharmacy: Yes  Refer to Home Health: No Refer to Advanced Heart Failure Clinic: Yes (assign to Dr. Shirlee Latch) Refer to General Cardiology: Yes (shared care)  Follow up in the [facility]. F/u w/ PharmD in 2-3 wks, APP in 4-6 wks and Dr. Shirlee Latch w/ Echo in 12 wks.   Robbie Lis, PA-C  11/22/2022

## 2022-11-22 NOTE — Addendum Note (Signed)
Encounter addended by: Allayne Butcher, PA-C on: 11/22/2022 10:50 AM  Actions taken: Clinical Note Signed

## 2022-11-24 NOTE — Progress Notes (Signed)
Advanced Heart Failure Clinic Note   Referring Physician: Dr. Eldridge DaceVaranasi  Primary Care: Pcp, No Primary Cardiologist: Bryan Lemmaavid Harding, MD HF Cardiologist: Dr. Shirlee LatchMcLean   HPI:  62 y/o male smoker w/ h/o HLD, Type 2DM and newly diagnosed CAD and systolic heart failure/ ischemic CM.    Admitted 10/2022 w/ acute inferior STEMI. Cath w/ 55% prox RCA, 100% thrombotic occluded mid RCA (culprit), s/p PCI +DES  to prox-mid RCA, residual 60% stenosis of distal RCA, 80% mid 1st diag (small vessel) and 70% prox 1st OM (small vessel) treated medically. LVEDP was 14 mmHg. Echo moderately reduced LVEF, 35-40%, GIIDD, interventricular septum flattened in diastole ('D' shaped left ventricle), consistent with right ventricular volume overload, RV systolic fx mildly reduced.    LDL 154, Lipoprotein a 213 Hgb A1c 11.7     Treated w/ ASA, Brilinta + atorvastatin. Entresto, Spironolactone and metoprolol succinate for GDMT. Was not discharged on loop diuretic. [facility] D/c wt 170 lb.    [facility].  Came with his wife. Reported doing fairly well. Denied CP. No significant resting dyspnea, denied orthopnea/PND. No LEE. Weight was up 10 lbs at 180 lbs on our scales but he attributed some of this to clothing. Home weights around 170-172 lb. ReDs mildly elevated at 38%. Reported full compliance w/ meds but ran out of spironolactone 2 days ago. Reported tolerating meds ok w/o side effects. Denied dizziness. Had been checking glucose levels at home, fasting levels ~180. Had quit smoking.   Today he returns to HF clinic for pharmacist medication titration. At last visit with APP, Farxiga 10 mg daily was initiated. Patient was referred to Advanced HF Clinic. Overall feels well today. Notes some episodes of dizziness when walking upstairs. These episodes last a few seconds and then resolve. Is able to walk around the yard fine without SOB. However, does get SOB when going  up steps. Has had a few episodes of trying to "catch his breath" while sitting in the chair. These episodes resolve after a few seconds. No CP or palpitations. Has not had to use any SL NTG. Weight at home has been stable, ~169-170 lbs. He does not need a loop diuretic. No LEE, PND or orthopnea. Appetite has not been as good but states this is because he has been trying to eat healthier options. Taking and tolerating all medications. Does not have a PCP.    HF Medications: Metoprolol succinate 50 mg daily Entresto 24/26 mg BID Spironolactone 25 mg daily Farxiga 10 mg daily  Has the patient been experiencing any side effects to the medications prescribed?  no  Does the patient have any problems obtaining medications due to transportation or finances?   No - [employer] insurance  Understanding of regimen: fair - his wife helps him manage his medications.  Understanding of indications: good Potential of compliance: good Patient understands to avoid NSAIDs. Patient understands to avoid decongestants.    Pertinent Lab Values: 11/22/22: Serum creatinine 0.96, BUN 13, Potassium 4.4, Sodium 135, BNP 35.6  Vital Signs: Weight: 175.4 lbs (last clinic weight: 180.2 lbs) Blood pressure: 122/74  Heart rate: 58   Assessment/Plan: 1. Chronic Systolic Heart Failure - ischemic/ possibly mixed CM  - s/p Inferior STEMI 10/2022 due to occluded mRCA, s/p PCI + residual disease in small 1st diag and 1st OM treated medically  - Echo EF 35-40%, RV mildly reduced  - NYHA Class II. Euvolemic on exam -  Does not need loop diuretic currently  - Continue metoprolol succinate 50 mg daily. HR 58. - Increase Entresto to 49/51 mg BID. Repeat BMET in 3 weeks at follow up. - Continue Spironolactone 25 mg daily  - Continue Farxiga 10 mg daily  - Continue GDMT titration until fully optimized followed by repeat echo in 3 months. If EF not improving, would recommend cMRI to assess for possible nonischemic  component.    2. CAD - s/p inferior STEMI w/ stent to prox-mid RCA, + residual disease in distal RCA, 1st diag and 1st OM (medical management, vessels too small for stenting) - stable w/o CP  - Continue ASA + Brilinta - Continue atorvastatin 80 mg daily - Continue metoprolol succinate 50 mg daily - has quit smoking    3. Type 2DM  - Poorly controlled, Hgb A1c 11.7 - on insulin + Metformin + Farxiga - Does not have a PCP. Provided him a packet with local PCPs today.    4. HLD, LDL Goal < 55  - LDL 154, Lipoprotein a 213 -Continue atorvastatin 80 mg nightly  - needs repeat FLP and HFT in 6 wks. If LDL not at goal, would recommend referral to lipid clinic for PCSK9i    5. Tobacco Abuse  - has quit smoking  - congratulated on efforts     Follow up 3 weeks with [facility]   Karle Plumber, PharmD, BCPS, BCCP, CPP Heart Failure Clinic Pharmacist [phone]

## 2022-12-07 ENCOUNTER — Other Ambulatory Visit (HOSPITAL_COMMUNITY): Payer: Self-pay

## 2022-12-09 ENCOUNTER — Other Ambulatory Visit (HOSPITAL_COMMUNITY): Payer: Self-pay

## 2022-12-09 ENCOUNTER — Ambulatory Visit (HOSPITAL_COMMUNITY)
Admission: RE | Admit: 2022-12-09 | Discharge: 2022-12-09 | Disposition: A | Discharge status: Home / Self Care | Payer: BC Managed Care – PPO | Source: Ambulatory Visit | Attending: Cardiology | Admitting: Cardiology

## 2022-12-09 VITALS — BP 122/74 | HR 58 | Wt 175.4 lb

## 2022-12-09 DIAGNOSIS — E785 Hyperlipidemia, unspecified: Secondary | ICD-10-CM | POA: Diagnosis not present

## 2022-12-09 DIAGNOSIS — Z951 Presence of aortocoronary bypass graft: Secondary | ICD-10-CM | POA: Diagnosis not present

## 2022-12-09 DIAGNOSIS — I252 Old myocardial infarction: Secondary | ICD-10-CM | POA: Insufficient documentation

## 2022-12-09 DIAGNOSIS — Z7984 Long term (current) use of oral hypoglycemic drugs: Secondary | ICD-10-CM | POA: Insufficient documentation

## 2022-12-09 DIAGNOSIS — Z87891 Personal history of nicotine dependence: Secondary | ICD-10-CM | POA: Diagnosis not present

## 2022-12-09 DIAGNOSIS — I5022 Chronic systolic (congestive) heart failure: Secondary | ICD-10-CM | POA: Diagnosis not present

## 2022-12-09 DIAGNOSIS — R0602 Shortness of breath: Secondary | ICD-10-CM | POA: Insufficient documentation

## 2022-12-09 DIAGNOSIS — I255 Ischemic cardiomyopathy: Secondary | ICD-10-CM | POA: Insufficient documentation

## 2022-12-09 DIAGNOSIS — I2510 Atherosclerotic heart disease of native coronary artery without angina pectoris: Secondary | ICD-10-CM | POA: Insufficient documentation

## 2022-12-09 DIAGNOSIS — Z79899 Other long term (current) drug therapy: Secondary | ICD-10-CM | POA: Insufficient documentation

## 2022-12-09 MED ORDER — ENTRESTO 49-51 MG PO TABS: 1.0000 | ORAL_TABLET | Freq: Two times a day (BID) | ORAL | 3 refills | Status: DC

## 2022-12-09 NOTE — Patient Instructions (Signed)
It was a pleasure seeing you today!  MEDICATIONS: -We are changing your medications today -Increase Entresto to 49/51 mg (1 tablet) twice daily. You may take 2 tablets of the 24/26 mg strength twice daily until you receive the new strength.  -Call if you have questions about your medications.  NEXT APPOINTMENT: Return to clinic in 4 weeks with [facility].  In general, to take care of your heart failure: -Limit your fluid intake to 2 Liters (half-gallon) per day.   -Limit your salt intake to ideally 2-3 grams (2000-3000 mg) per day. -Weigh yourself daily and record, and bring that "weight diary" to your next appointment.  (Weight gain of 2-3 pounds in 1 day typically means fluid weight.) -The medications for your heart are to help your heart and help you live longer.   -Please contact us before stopping any of your heart medications.  Call the clinic at [phone] with questions or to reschedule future appointments.  

## 2022-12-28 NOTE — Progress Notes (Addendum)
ADVANCED HF CLINIC CONSULT NOTE  Primary Care: Pcp, No Primary Cardiologist: Bryan Lemma, MD HF Cardiologist: Dr. Shirlee Latch  HPI:   62 y.o. male smoker w/ h/o HLD, Type 2DM and newly diagnosed CAD and systolic heart failure/ ischemic CM.    Admitted 3/24 w/ acute inferior STEMI. Cath w/ 55% prox RCA, 100% thrombotic occluded mid RCA (culprit), s/p PCI +DES  to prox-mid RCA, residual 60% stenosis of distal RCA, 80% mid 1st diag (small vessel) and 70% prox 1st OM (small vessel) treated medically. LVEDP was 14 mmHg. Echo showed EF, 35-40%, GIIDD, interventricular septum flattened in diastole ('D' shaped left ventricle), consistent with right ventricular volume overload, RV systolic fx mildly reduced. Treated w/ ASA, Brilinta + atorvastatin. Entresto, Sprio and Toprol XL for GDMT. Was not discharged on loop diuretic. Referred to [facility] clinic. D/c wt 170 lb.    Seen in [facility] 4/24 for post hospital follow up. NYHA II with mild volume overload. Farxiga added and referred to AHF clinic.  Today he returns for HF follow up with his wife, [name]. Overall feeling fine. He has SOB walking up steps or further distances on flat ground. He has occasional dizziness, he attributes this to his vision. Denies palpitations, CP, edema, or PND/Orthopnea. Appetite ok. No fever or chills. Weight at home 169 pounds. Taking all medications. Remains quit from cigarettes since 10/2022, no ETOH/drugs.  ECG (personally reviewed): SB 57 bpm  Labs (4/24): K 4.4, creatinine 0.96, LDL 154, Lp (a) 213, A1C 11.7   Cardiac Testing  - Echo (3/24): EF 35-40%, grade II DD, RV mildly reduced.  - LHC (3/24):  55% prox RCA, 100% thrombotic occluded mid RCA (culprit), s/p PCI +DES  to prox-mid RCA, residual 60% stenosis of distal RCA, 80% mid 1st diag (small vessel) and 70% prox 1st OM (small vessel) treated medically. LVEDP was 14 mmHg   Review of Systems: [y] = yes, [ ]  = no   General: Weight gain [ ] ; Weight loss [ ] ; Anorexia [ ] ;  Fatigue [ ] ; Fever [ ] ; Chills [ ] ; Weakness [ ]   Cardiac: Chest pain/pressure [ ] ; Resting SOB [ ] ; Exertional SOB Cove.Etienne ]; Orthopnea [ ] ; Pedal Edema [ ] ; Palpitations [ ] ; Syncope [ ] ; Presyncope [ ] ; Paroxysmal nocturnal dyspnea[ ]   Pulmonary: Cough [ ] ; Wheezing[ ] ; Hemoptysis[ ] ; Sputum [ ] ; Snoring [ ]   GI: Vomiting[ ] ; Dysphagia[ ] ; Melena[ ] ; Hematochezia [ ] ; Heartburn[ ] ; Abdominal pain [ ] ; Constipation [ ] ; Diarrhea [ ] ; BRBPR [ ]   GU: Hematuria[ ] ; Dysuria [ ] ; Nocturia[ ]   Vascular: Pain in legs with walking [ ] ; Pain in feet with lying flat [ ] ; Non-healing sores [ ] ; Stroke [ ] ; TIA [ ] ; Slurred speech [ ] ;  Neuro: Headaches[ ] ; Vertigo[ ] ; Seizures[ ] ; Paresthesias[ ] ;Blurred vision [ ] ; Diplopia [ ] ; Vision changes [ ]   Ortho/Skin: Arthritis [ ] ; Joint pain [ ] ; Muscle pain [ ] ; Joint swelling [ ] ; Back Pain Cove.Etienne ]; Rash [ ]   Psych: Depression[ ] ; Anxiety[ ]   Heme: Bleeding problems [ ] ; Clotting disorders [ ] ; Anemia [ ]   Endocrine: Diabetes Cove.Etienne ]; Thyroid dysfunction[ ]   Past Medical History:  Diagnosis Date   Acute ST elevation myocardial infarction (STEMI) of inferior wall (HCC) 11/02/2022   Coronary artery disease involving native coronary artery of native heart with unstable angina pectoris (HCC) 11/03/2022   04/04/2023     Culprit lesion: Heavily thrombotic ulcerated /calcified prox-mid RCA 100% occlusion  around crux after an RVM branch.  (After angioplasty and stenting there is a long area of the distal RCA into the PDA which is about 65% stenosed => improved some w/ NTG IC, so plan MedRx; OM1 90%, D1 80% (Med Rx).   Current smoker 11/03/2022   Hyperlipidemia associated with type 2 diabetes mellitus (HCC) 11/03/2022   Ischemic cardiomyopathy 11/03/2022   TTE 11/03/2022-s/p inferior STEMI: EF 35 to 40%.  Moderate reduced function.  Global HK.  GR 2 DD.  D-shaped IVS consistent with RV volume overload.  RV function mildly reduced, mildly enlarged.   Presence of drug coated stent  in right coronary artery 11/03/2022   Overlapping Synergr DES 2.5 x 48 & 2.75 x 12 - tapered post-dilation 3.1-2.8-2.6 mm (prox-~distal RCA) in setting of Inf STEMI -100% prox RCA   Type 2 diabetes mellitus with complication, without long-term current use of insulin (HCC) 11/03/2022    Current Outpatient Medications  Medication Sig Dispense Refill   Accu-Chek Softclix Lancets lancets Use as directed 100 each 0   aspirin EC 81 MG tablet Take 1 tablet (81 mg total) by mouth daily. Swallow whole. 90 tablet 3   atorvastatin (LIPITOR) 80 MG tablet Take 1 tablet (80 mg total) by mouth daily. 90 tablet 3   Blood Glucose Monitoring Suppl (ACCU-CHEK GUIDE) w/Device KIT Use up to four times daily as directed 1 kit 0   dapagliflozin propanediol (FARXIGA) 10 MG TABS tablet Take 1 tablet (10 mg total) by mouth daily before breakfast. 30 tablet 11   glucose blood test strip Use as directed 100 each 0   insulin glargine-yfgn (SEMGLEE) 100 UNIT/ML Pen Inject 12 Units into the skin daily. 9 mL 3   Insulin Pen Needle 32G X 4 MM MISC Use daily 100 each 3   metFORMIN (GLUCOPHAGE) 500 MG tablet Take 1 tablet (500 mg total) by mouth daily with breakfast. 30 tablet 11   metoprolol succinate (TOPROL XL) 50 MG 24 hr tablet Take 1 tablet (50 mg total) by mouth daily. Take with or immediately following a meal. 90 tablet 3   nitroGLYCERIN (NITROSTAT) 0.4 MG SL tablet Place 1 tablet (0.4 mg total) under the tongue every 5 (five) minutes x 3 doses as needed for chest pain. 25 tablet 0   sacubitril-valsartan (ENTRESTO) 49-51 MG Take 1 tablet by mouth 2 (two) times daily. 180 tablet 3   spironolactone (ALDACTONE) 25 MG tablet Take 1 tablet (25 mg total) by mouth daily. 90 tablet 3   ticagrelor (BRILINTA) 90 MG TABS tablet Take 1 tablet (90 mg total) by mouth 2 (two) times daily. 180 tablet 1   No current facility-administered medications for this encounter.    No Known Allergies    Social History   Socioeconomic  History   Marital status: Married    Spouse name: [name]   Number of children: 3   Years of education: Not on file   Highest education level: High school graduate  Occupational History   Occupation: Retired  Tobacco Use   Smoking status: Former    Years: 45    Types: Cigarettes    Quit date: 11/02/2022    Years since quitting: 0.1    Passive exposure: Never   Smokeless tobacco: Never  Vaping Use   Vaping Use: Never used  Substance and Sexual Activity   Alcohol use: Yes    Alcohol/week: 12.0 standard drinks of alcohol    Types: 12 Cans of beer per week    Comment:  12 pack per month   Drug use: Never   Sexual activity: Not on file  Other Topics Concern   Not on file  Social History Narrative   Not on file   Social Determinants of Health   Financial Resource Strain: Low Risk  (11/04/2022)   Overall Financial Resource Strain (CARDIA)    Difficulty of Paying Living Expenses: Not hard at all  Food Insecurity: No Food Insecurity (11/04/2022)   Hunger Vital Sign    Worried About Running Out of Food in the Last Year: Never true    Ran Out of Food in the Last Year: Never true  Transportation Needs: No Transportation Needs (11/04/2022)   PRAPARE - Administrator, Civil Service (Medical): No    Lack of Transportation (Non-Medical): No  Physical Activity: Not on file  Stress: Not on file  Social Connections: Not on file  Intimate Partner Violence: Not on file     No family history on file.  BP 98/66   Pulse (!) 58   Wt 80.1 kg (176 lb 9.6 oz)   SpO2 98%   BMI 26.85 kg/m   Wt Readings from Last 3 Encounters:  01/04/23 80.1 kg (176 lb 9.6 oz)  12/09/22 79.6 kg (175 lb 6.4 oz)  11/22/22 81.7 kg (180 lb 3.2 oz)   PHYSICAL EXAM: General:  NAD. No resp difficulty, walked into clinic HEENT: Normal Neck: Supple. No JVD. Carotids 2+ bilat; no bruits. No lymphadenopathy or thryomegaly appreciated. Cor: PMI nondisplaced. Regular rate & rhythm. No rubs, gallops or  murmurs. Lungs: Clear Abdomen: Soft, nontender, nondistended. No hepatosplenomegaly. No bruits or masses. Good bowel sounds. Extremities: No cyanosis, clubbing, rash, edema Neuro: Alert & oriented x 3, cranial nerves grossly intact. Moves all 4 extremities w/o difficulty. Affect pleasant.  ASSESSMENT & PLAN: 1. Chronic Systolic Heart Failure: ischemic/ possibly mixed CM.  s/p Inferior STEMI 3/24 due to occluded mRCA, s/p PCI + residual disease in small 1st diag and 1st OM treated medically.  Echo EF 35-40%, RV mildly reduced. NYHA II-early III, suspect component of COPD and physical deconditioning contributory. He is not volume overloaded on exam.  - Continue Farxiga 10 mg daily. BMET today.  - Continue spironolactone 25 mg daily  - Continue Entresto 49/51 mg bid. BP too soft for titration  - Continue Toprol XL 50 mg daily.  - Does not need loop diuretic currently  - Repeat echo in 6-8 weels. If EF not improving, would recommend cMRI to assess for possible nonischemic component. 2. CAD: s/p inferior STEMI w/ stent to prox-mid RCA, + residual disease in distal RCA, 1st diag and 1st OM (medical management, vessels too small for stenting). No chest pain. - Continue ASA + Brilinta. - Continue statin.  - Continue ? blocker. - Refer to CR at [facility] 3. Type 2DM: hgb A1c 11.7.   - Continue insulin + Metformin.  - Continue Farxiga 10 mg daily. No GU symptoms. 4. HLD: LDL Goal < 55. LDL 154, Lp (a) 213. - Continue atorvastatin 80 mg nightly. Check LFTs and lipids today. If LDL not at goal, would recommend referral to lipid clinic for PCSK9i  5. Tobacco Abuse: has quit smoking since 3/24.  - Congratulated on efforts   Follow up in 6-8 weeks with Dr. Shirlee Latch + echo.  Given a list of PCPs today to establish care.  Prince Rome, FNP-BC 01/04/23

## 2023-01-03 ENCOUNTER — Encounter (HOSPITAL_COMMUNITY): Payer: BC Managed Care – PPO

## 2023-01-04 ENCOUNTER — Encounter (HOSPITAL_COMMUNITY): Payer: Self-pay

## 2023-01-04 ENCOUNTER — Ambulatory Visit (HOSPITAL_COMMUNITY)
Admission: RE | Admit: 2023-01-04 | Discharge: 2023-01-04 | Disposition: A | Discharge status: Home / Self Care | Payer: BC Managed Care – PPO | Source: Ambulatory Visit | Attending: Family Medicine | Admitting: Family Medicine

## 2023-01-04 VITALS — BP 98/66 | HR 58 | Wt 176.6 lb

## 2023-01-04 DIAGNOSIS — E785 Hyperlipidemia, unspecified: Secondary | ICD-10-CM | POA: Insufficient documentation

## 2023-01-04 DIAGNOSIS — E1159 Type 2 diabetes mellitus with other circulatory complications: Secondary | ICD-10-CM

## 2023-01-04 DIAGNOSIS — I5022 Chronic systolic (congestive) heart failure: Secondary | ICD-10-CM | POA: Diagnosis not present

## 2023-01-04 DIAGNOSIS — I252 Old myocardial infarction: Secondary | ICD-10-CM | POA: Diagnosis not present

## 2023-01-04 DIAGNOSIS — Z7984 Long term (current) use of oral hypoglycemic drugs: Secondary | ICD-10-CM | POA: Diagnosis not present

## 2023-01-04 DIAGNOSIS — Z7982 Long term (current) use of aspirin: Secondary | ICD-10-CM | POA: Insufficient documentation

## 2023-01-04 DIAGNOSIS — Z7902 Long term (current) use of antithrombotics/antiplatelets: Secondary | ICD-10-CM | POA: Insufficient documentation

## 2023-01-04 DIAGNOSIS — I255 Ischemic cardiomyopathy: Secondary | ICD-10-CM | POA: Insufficient documentation

## 2023-01-04 DIAGNOSIS — E119 Type 2 diabetes mellitus without complications: Secondary | ICD-10-CM | POA: Insufficient documentation

## 2023-01-04 DIAGNOSIS — Z87891 Personal history of nicotine dependence: Secondary | ICD-10-CM | POA: Diagnosis not present

## 2023-01-04 DIAGNOSIS — Z955 Presence of coronary angioplasty implant and graft: Secondary | ICD-10-CM | POA: Diagnosis not present

## 2023-01-04 DIAGNOSIS — Z794 Long term (current) use of insulin: Secondary | ICD-10-CM | POA: Insufficient documentation

## 2023-01-04 DIAGNOSIS — R0602 Shortness of breath: Secondary | ICD-10-CM | POA: Insufficient documentation

## 2023-01-04 DIAGNOSIS — Z9861 Coronary angioplasty status: Secondary | ICD-10-CM

## 2023-01-04 DIAGNOSIS — Z79899 Other long term (current) drug therapy: Secondary | ICD-10-CM | POA: Insufficient documentation

## 2023-01-04 DIAGNOSIS — I2510 Atherosclerotic heart disease of native coronary artery without angina pectoris: Secondary | ICD-10-CM | POA: Diagnosis not present

## 2023-01-04 LAB — COMPREHENSIVE METABOLIC PANEL
ALT: 38 U/L (ref 0–44)
AST: 25 U/L (ref 15–41)
Albumin: 3.8 g/dL (ref 3.5–5.0)
Alkaline Phosphatase: 74 U/L (ref 38–126)
Anion gap: 9 (ref 5–15)
BUN: 14 mg/dL (ref 8–23)
CO2: 21 mmol/L — ABNORMAL LOW (ref 22–32)
Calcium: 9.6 mg/dL (ref 8.9–10.3)
Chloride: 103 mmol/L (ref 98–111)
Creatinine, Ser: 1.04 mg/dL (ref 0.61–1.24)
GFR, Estimated: >60 mL/min (ref >60)
Glucose, Bld: 161 mg/dL — ABNORMAL HIGH (ref 70–99)
Potassium: 4.1 mmol/L (ref 3.5–5.1)
Sodium: 133 mmol/L — ABNORMAL LOW (ref 135–145)
Total Bilirubin: 0.6 mg/dL (ref 0.3–1.2)
Total Protein: 7.4 g/dL (ref 6.5–8.1)

## 2023-01-04 LAB — LIPID PANEL
Cholesterol: 128 mg/dL (ref 0–200)
HDL: 33 mg/dL — ABNORMAL LOW (ref >40)
LDL Cholesterol: 73 mg/dL (ref 0–99)
Total CHOL/HDL Ratio: 3.9 RATIO
Triglycerides: 108 mg/dL (ref <150)
VLDL: 22 mg/dL (ref 0–40)

## 2023-01-04 NOTE — Patient Instructions (Signed)
It was great to see you today! No medication changes are needed at this time.  Labs today We will only contact you if something comes back abnormal or we need to make some changes. Otherwise no news is good news!  You have been referred to Cardiac Rehab at [facility] -they will be in touch with an appointment  Your physician recommends that you schedule a follow-up appointment in: 6-8 weeks with Dr Shirlee Latch  Do the following things EVERYDAY: Weigh yourself in the morning before breakfast. Write it down and keep it in a log. Take your medicines as prescribed Eat low salt foods--Limit salt (sodium) to 2000 mg per day.  Stay as active as you can everyday Limit all fluids for the day to less than 2 liters  At the [facility], you and your health needs are our priority. As part of our continuing mission to provide you with exceptional heart care, we have created designated Provider Care Teams. These Care Teams include your primary Cardiologist (physician) and Advanced Practice Providers (APPs- Physician Assistants and Nurse Practitioners) who all work together to provide you with the care you need, when you need it.   You may see any of the following providers on your designated Care Team at your next follow up: Dr Arvilla Meres Dr [name] Dr. Marcos Eke, NP Robbie Lis, [place] [facility] [place], [place] Brynda Peon, NP Karle Plumber, PharmD   Please be sure to bring in all your medications bottles to every appointment.    Thank you for choosing [facility]

## 2023-01-04 NOTE — Addendum Note (Signed)
Encounter addended by: Jacklynn Ganong, FNP on: 01/04/2023 9:24 AM  Actions taken: Clinical Note Signed

## 2023-02-14 ENCOUNTER — Other Ambulatory Visit (HOSPITAL_COMMUNITY): Payer: BC Managed Care – PPO

## 2023-03-08 ENCOUNTER — Encounter (HOSPITAL_COMMUNITY): Payer: Self-pay | Admitting: Cardiology

## 2023-03-08 ENCOUNTER — Ambulatory Visit (HOSPITAL_COMMUNITY)
Admission: RE | Admit: 2023-03-08 | Discharge: 2023-03-08 | Disposition: A | Discharge status: Home / Self Care | Payer: BC Managed Care – PPO | Source: Ambulatory Visit | Attending: Cardiology | Admitting: Cardiology

## 2023-03-08 ENCOUNTER — Ambulatory Visit (HOSPITAL_BASED_OUTPATIENT_CLINIC_OR_DEPARTMENT_OTHER)
Admission: RE | Admit: 2023-03-08 | Discharge: 2023-03-08 | Disposition: A | Discharge status: Home / Self Care | Payer: BC Managed Care – PPO | Source: Ambulatory Visit | Attending: Cardiology | Admitting: Cardiology

## 2023-03-08 VITALS — BP 120/78 | HR 62 | Wt 177.8 lb

## 2023-03-08 DIAGNOSIS — Z7902 Long term (current) use of antithrombotics/antiplatelets: Secondary | ICD-10-CM | POA: Insufficient documentation

## 2023-03-08 DIAGNOSIS — I252 Old myocardial infarction: Secondary | ICD-10-CM | POA: Diagnosis not present

## 2023-03-08 DIAGNOSIS — Z87891 Personal history of nicotine dependence: Secondary | ICD-10-CM | POA: Insufficient documentation

## 2023-03-08 DIAGNOSIS — E119 Type 2 diabetes mellitus without complications: Secondary | ICD-10-CM | POA: Diagnosis not present

## 2023-03-08 DIAGNOSIS — I5022 Chronic systolic (congestive) heart failure: Secondary | ICD-10-CM | POA: Diagnosis not present

## 2023-03-08 DIAGNOSIS — R001 Bradycardia, unspecified: Secondary | ICD-10-CM | POA: Diagnosis not present

## 2023-03-08 DIAGNOSIS — Z955 Presence of coronary angioplasty implant and graft: Secondary | ICD-10-CM | POA: Insufficient documentation

## 2023-03-08 DIAGNOSIS — E1169 Type 2 diabetes mellitus with other specified complication: Secondary | ICD-10-CM | POA: Diagnosis not present

## 2023-03-08 DIAGNOSIS — I2510 Atherosclerotic heart disease of native coronary artery without angina pectoris: Secondary | ICD-10-CM | POA: Diagnosis not present

## 2023-03-08 DIAGNOSIS — E785 Hyperlipidemia, unspecified: Secondary | ICD-10-CM | POA: Diagnosis not present

## 2023-03-08 DIAGNOSIS — Z79899 Other long term (current) drug therapy: Secondary | ICD-10-CM | POA: Diagnosis not present

## 2023-03-08 DIAGNOSIS — I255 Ischemic cardiomyopathy: Secondary | ICD-10-CM | POA: Insufficient documentation

## 2023-03-08 LAB — BASIC METABOLIC PANEL
Anion gap: 6 (ref 5–15)
BUN: 11 mg/dL (ref 8–23)
CO2: 24 mmol/L (ref 22–32)
Calcium: 9.4 mg/dL (ref 8.9–10.3)
Chloride: 105 mmol/L (ref 98–111)
Creatinine, Ser: 1 mg/dL (ref 0.61–1.24)
GFR, Estimated: >60 mL/min (ref >60)
Glucose, Bld: 135 mg/dL — ABNORMAL HIGH (ref 70–99)
Potassium: 4 mmol/L (ref 3.5–5.1)
Sodium: 135 mmol/L (ref 135–145)

## 2023-03-08 LAB — ECHOCARDIOGRAM COMPLETE
Area-P 1/2: 2.69 cm2
Calc EF: 58.3 %
S' Lateral: 2.6 cm
Single Plane A2C EF: 60.2 %
Single Plane A4C EF: 57.9 %

## 2023-03-08 LAB — BRAIN NATRIURETIC PEPTIDE: B Natriuretic Peptide: 9.2 pg/mL (ref 0.0–100.0)

## 2023-03-08 NOTE — Progress Notes (Incomplete)
ADVANCED HF CLINIC CONSULT NOTE  Primary Care: Pcp, No HF Cardiologist: Dr. Shirlee Latch   62 y.o. male smoker w/ h/o HLD, Type 2DM and CAD with systolic heart failure/ ischemic CMP.    Admitted 3/24 with acute inferior STEMI. Cath with 55% prox RCA, 100% thrombotic occluded mid RCA (culprit), s/p PCI +DES  to prox-mid RCA, residual 60% stenosis of distal RCA, 80% mid 1st diag (small vessel) and 70% prox 1st OM (small vessel) treated medically. LVEDP was 14 mmHg. Echo showed EF, 35-40%, GIIDD, interventricular septum flattened in diastole ('D' shaped left ventricle), consistent with right ventricular volume overload, RV systolic fx mildly reduced. Suspected RV infarction.  Treated w/ ASA, Brilinta + atorvastatin. Entresto, spironolactone and Toprol XL for GDMT. Was not discharged on loop diuretic. Referred to [facility] clinic. D/c wt 170 lb.    Seen in [facility] 4/24 for post hospital follow up. NYHA II with mild volume overload. Farxiga added and referred to AHF clinic.  Echo was done today and reviewed, EF 55-60%, basal inferior hypokinesis, mildly decreased RV systolic function, normal IVC.   Patient returns for followup of CHF and CAD.  He has not had any chest pain.  He is occasionally short of breath walking fast up stairs.  He is able to mow grass and do other yardwork without much problem.  He is staying off cigarettes. Weight stable. No orthopnea/PND.   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 Prior smoker 2. Hyperlipidemia 3. Type 2 diabetes 4. CAD: Acute inferior MI complicated by RV infarction in 3/24. Cath showed 55% prox RCA, 100% thrombotic occluded mid RCA (culprit), s/p PCI +DES  to prox-mid RCA, residual 60% stenosis of distal RCA, 80% mid 1st diag (small vessel) and 70% prox 1st OM (small vessel) treated medically. LVEDP was 14 mmHg 5. Chronic systolic CHF: Ischemic  cardiomyopathy. Echo (3/24): EF 35-40%, grade II DD, RV mildly reduced. - Echo (7/24): EF 55-60%, basal inferior hypokinesis, mildly decreased RV systolic function, normal IVC.    Review of Systems: All systems reviewed and negative except as per HPI.    Current Outpatient Medications  Medication Sig Dispense Refill  . Accu-Chek Softclix Lancets lancets Use as directed 100 each 0  . aspirin EC 81 MG tablet Take 1 tablet (81 mg total) by mouth daily. Swallow whole. 90 tablet 3  . atorvastatin (LIPITOR) 80 MG tablet Take 1 tablet (80 mg total) by mouth daily. 90 tablet 3  . Blood Glucose Monitoring Suppl (ACCU-CHEK GUIDE) w/Device KIT Use up to four times daily as directed 1 kit 0  . dapagliflozin propanediol (FARXIGA) 10 MG TABS tablet Take 1 tablet (10 mg total) by mouth daily before breakfast. 30 tablet 11  . glucose blood test strip Use as directed 100 each 0  . insulin glargine-yfgn (SEMGLEE) 100 UNIT/ML Pen Inject 12 Units into the skin daily. 9 mL 3  . Insulin Pen Needle 32G X 4 MM MISC Use daily 100 each 3  . metFORMIN (GLUCOPHAGE) 500 MG tablet Take 1 tablet (500 mg total) by mouth daily with breakfast. 30 tablet 11  . metoprolol succinate (TOPROL XL) 50 MG 24 hr tablet Take 1 tablet (50 mg total) by mouth daily. Take with or immediately following a meal. 90 tablet 3  . nitroGLYCERIN (NITROSTAT) 0.4 MG SL tablet Place 1 tablet (0.4 mg total) under the  tongue every 5 (five) minutes x 3 doses as needed for chest pain. 25 tablet 0  . sacubitril-valsartan (ENTRESTO) 49-51 MG Take 1 tablet by mouth 2 (two) times daily. 180 tablet 3  . spironolactone (ALDACTONE) 25 MG tablet Take 1 tablet (25 mg total) by mouth daily. 90 tablet 3  . ticagrelor (BRILINTA) 90 MG TABS tablet Take 1 tablet (90 mg total) by mouth 2 (two) times daily. 180 tablet 1   No current facility-administered medications for this encounter.    No Known Allergies    Social History   Socioeconomic History  . Marital  status: Married    Spouse name: [name]  . Number of children: 3  . Years of education: Not on file  . Highest education level: High school graduate  Occupational History  . Occupation: Retired  Tobacco Use  . Smoking status: Former    Current packs/day: 0.00    Types: Cigarettes    Start date: 11/01/1977    Quit date: 11/02/2022    Years since quitting: 0.3    Passive exposure: Never  . Smokeless tobacco: Never  Vaping Use  . Vaping status: Never Used  Substance and Sexual Activity  . Alcohol use: Yes    Alcohol/week: 12.0 standard drinks of alcohol    Types: 12 Cans of beer per week    Comment: 12 pack per month  . Drug use: Never  . Sexual activity: Not on file  Other Topics Concern  . Not on file  Social History Narrative  . Not on file   Social Determinants of Health   Financial Resource Strain: Low Risk  (11/04/2022)   Overall Financial Resource Strain (CARDIA)   . Difficulty of Paying Living Expenses: Not hard at all  Food Insecurity: No Food Insecurity (11/04/2022)   Hunger Vital Sign   . Worried About Programme researcher, broadcasting/film/video in the Last Year: Never true   . Ran Out of Food in the Last Year: Never true  Transportation Needs: No Transportation Needs (11/04/2022)   PRAPARE - Transportation   . Lack of Transportation (Medical): No   . Lack of Transportation (Non-Medical): No  Physical Activity: Not on file  Stress: Not on file  Social Connections: Not on file  Intimate Partner Violence: Not on file   FH: Father had MI.   BP 120/78   Pulse 62   Wt 80.6 kg (177 lb 12.8 oz)   SpO2 98%   BMI 27.03 kg/m   Wt Readings from Last 3 Encounters:  03/08/23 80.6 kg (177 lb 12.8 oz)  01/04/23 80.1 kg (176 lb 9.6 oz)  12/09/22 79.6 kg (175 lb 6.4 oz)   PHYSICAL EXAM: General: NAD Neck: No JVD, no thyromegaly or thyroid nodule.  Lungs: Clear to auscultation bilaterally with normal respiratory effort. CV: Nondisplaced PMI.  Heart regular S1/S2, no S3/S4, no murmur.  No  peripheral edema.  No carotid bruit.  Normal pedal pulses.  Abdomen: Soft, nontender, no hepatosplenomegaly, no distention.  Skin: Intact without lesions or rashes.  Neurologic: Alert and oriented x 3.  Psych: Normal affect. Extremities: No clubbing or cyanosis.  HEENT: Normal.   ASSESSMENT & PLAN: 1. Chronic systolic CHF: Ischemic cardiomyopathy.  Inferior STEMI complicated by RV infarction in 3/24 due to occluded mRCA, s/p PCI.  Echo in 3/24 with EF 35-40%, RV mildly reduced. Echo in 7/24 with EF 55-60%, basal inferior hypokinesis, mildly decreased RV systolic function, normal IVC. NYHA class II symptoms, not volume overloaded on exam.  -  Continue Farxiga 10 mg daily. BMET/BNP today.  - Continue spironolactone 25 mg daily  - Continue Entresto 49/51 mg bid.  - Continue Toprol XL 50 mg daily.  - Does not need loop diuretic currently  2. CAD: s/p inferior STEMI w/ stent to prox-mid RCA, + residual disease in distal RCA, 1st diag and 1st OM (medical management, vessels too small for stenting). No chest pain. - Continue ASA + Brilinta. - Continue statin.  - Continue  blocker. - Refer to CR at [facility] 3. Type 2DM: hgb A1c 11.7.   - Continue insulin + Metformin.  - Continue Farxiga 10 mg daily. No GU symptoms. 4. HLD: LDL Goal < 55. LDL 154, Lp (a) 213. - Continue atorvastatin 80 mg nightly. Check LFTs and lipids today. If LDL not at goal, would recommend referral to lipid clinic for PCSK9i  5. Tobacco Abuse: has quit smoking since 3/24.  - Congratulated on efforts   Follow up in 6-8 weeks with Dr. Shirlee Latch + echo.  Given a list of PCPs today to establish care.  Prince Rome, FNP-BC 03/08/23

## 2023-03-08 NOTE — Progress Notes (Signed)
Echocardiogram 2D Echocardiogram has been performed.  Daniel Bautista 03/08/2023, 9:37 AM

## 2023-03-08 NOTE — Progress Notes (Signed)
ADVANCED HF CLINIC CONSULT NOTE  Primary Care: Pcp, No HF Cardiologist: Dr. Shirlee Latch   62 y.o. male smoker w/ h/o HLD, Type 2DM and CAD with systolic heart failure/ ischemic CMP.    Admitted 3/24 with acute inferior STEMI. Cath with 55% prox RCA, 100% thrombotic occluded mid RCA (culprit), s/p PCI +DES  to prox-mid RCA, residual 60% stenosis of distal RCA, 80% mid 1st diag (small vessel) and 70% prox 1st OM (small vessel) treated medically. LVEDP was 14 mmHg. Echo showed EF, 35-40%, GIIDD, interventricular septum flattened in diastole ('D' shaped left ventricle), consistent with right ventricular volume overload, RV systolic fx mildly reduced. Suspected RV infarction.  Treated w/ ASA, Brilinta + atorvastatin. Entresto, spironolactone and Toprol XL for GDMT. Was not discharged on loop diuretic. Referred to [facility] clinic. D/c wt 170 lb.    Seen in [facility]/24 for post hospital follow up. NYHA II with mild volume overload. Farxiga added and referred to AHF clinic.  Echo was done today and reviewed, EF 55-60%, basal inferior hypokinesis, mildly decreased RV systolic function, normal IVC.   Patient returns for followup of CHF and CAD.  He has not had any chest pain.  He is occasionally short of breath walking fast up stairs.  He is able to mow grass and do other yardwork without much problem.  He is staying off cigarettes. Weight stable. No orthopnea/PND.   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 Prior smoker 2. Hyperlipidemia 3. Type 2 diabetes 4. CAD: Acute inferior MI complicated by RV infarction in 3/24. Cath showed 55% prox RCA, 100% thrombotic occluded mid RCA (culprit), s/p PCI +DES  to prox-mid RCA, residual 60% stenosis of distal RCA, 80% mid 1st diag (small vessel) and 70% prox 1st OM (small vessel) treated medically. LVEDP was 14 mmHg 5. Chronic systolic CHF: Ischemic  cardiomyopathy. Echo (3/24): EF 35-40%, grade II DD, RV mildly reduced. - Echo (7/24): EF 55-60%, basal inferior hypokinesis, mildly decreased RV systolic function, normal IVC.    Review of Systems: All systems reviewed and negative except as per HPI.    Current Outpatient Medications  Medication Sig Dispense Refill   Accu-Chek Softclix Lancets lancets Use as directed 100 each 0   aspirin EC 81 MG tablet Take 1 tablet (81 mg total) by mouth daily. Swallow whole. 90 tablet 3   atorvastatin (LIPITOR) 80 MG tablet Take 1 tablet (80 mg total) by mouth daily. 90 tablet 3   Blood Glucose Monitoring Suppl (ACCU-CHEK GUIDE) w/Device KIT Use up to four times daily as directed 1 kit 0   dapagliflozin propanediol (FARXIGA) 10 MG TABS tablet Take 1 tablet (10 mg total) by mouth daily before breakfast. 30 tablet 11   glucose blood test strip Use as directed 100 each 0   insulin glargine-yfgn (SEMGLEE) 100 UNIT/ML Pen Inject 12 Units into the skin daily. 9 mL 3   Insulin Pen Needle 32G X 4 MM MISC Use daily 100 each 3   metFORMIN (GLUCOPHAGE) 500 MG tablet Take 1 tablet (500 mg total) by mouth daily with breakfast. 30 tablet 11   metoprolol succinate (TOPROL XL) 50 MG 24 hr tablet Take 1 tablet (50 mg total) by mouth daily. Take with or immediately following a meal. 90 tablet 3   nitroGLYCERIN (NITROSTAT) 0.4 MG SL tablet Place 1 tablet (0.4 mg total) under the  tongue every 5 (five) minutes x 3 doses as needed for chest pain. 25 tablet 0   sacubitril-valsartan (ENTRESTO) 49-51 MG Take 1 tablet by mouth 2 (two) times daily. 180 tablet 3   spironolactone (ALDACTONE) 25 MG tablet Take 1 tablet (25 mg total) by mouth daily. 90 tablet 3   ticagrelor (BRILINTA) 90 MG TABS tablet Take 1 tablet (90 mg total) by mouth 2 (two) times daily. 180 tablet 1   No current facility-administered medications for this encounter.    No Known Allergies    Social History   Socioeconomic History   Marital status: Married     Spouse name: [name]   Number of children: 3   Years of education: Not on file   Highest education level: High school graduate  Occupational History   Occupation: Retired  Tobacco Use   Smoking status: Former    Current packs/day: 0.00    Types: Cigarettes    Start date: 11/01/1977    Quit date: 11/02/2022    Years since quitting: 0.3    Passive exposure: Never   Smokeless tobacco: Never  Vaping Use   Vaping status: Never Used  Substance and Sexual Activity   Alcohol use: Yes    Alcohol/week: 12.0 standard drinks of alcohol    Types: 12 Cans of beer per week    Comment: 12 pack per month   Drug use: Never   Sexual activity: Not on file  Other Topics Concern   Not on file  Social History Narrative   Not on file   Social Determinants of Health   Financial Resource Strain: Low Risk  (11/04/2022)   Overall Financial Resource Strain (CARDIA)    Difficulty of Paying Living Expenses: Not hard at all  Food Insecurity: No Food Insecurity (11/04/2022)   Hunger Vital Sign    Worried About Running Out of Food in the Last Year: Never true    Ran Out of Food in the Last Year: Never true  Transportation Needs: No Transportation Needs (11/04/2022)   PRAPARE - Administrator, Civil Service (Medical): No    Lack of Transportation (Non-Medical): No  Physical Activity: Not on file  Stress: Not on file  Social Connections: Not on file  Intimate Partner Violence: Not on file   FH: Father had MI.   BP 120/78   Pulse 62   Wt 80.6 kg (177 lb 12.8 oz)   SpO2 98%   BMI 27.03 kg/m   Wt Readings from Last 3 Encounters:  03/08/23 80.6 kg (177 lb 12.8 oz)  01/04/23 80.1 kg (176 lb 9.6 oz)  12/09/22 79.6 kg (175 lb 6.4 oz)   PHYSICAL EXAM: General: NAD Neck: No JVD, no thyromegaly or thyroid nodule.  Lungs: Clear to auscultation bilaterally with normal respiratory effort. CV: Nondisplaced PMI.  Heart regular S1/S2, no S3/S4, no murmur.  No peripheral edema.  No carotid  bruit.  Normal pedal pulses.  Abdomen: Soft, nontender, no hepatosplenomegaly, no distention.  Skin: Intact without lesions or rashes.  Neurologic: Alert and oriented x 3.  Psych: Normal affect. Extremities: No clubbing or cyanosis.  HEENT: Normal.   ASSESSMENT & PLAN: 1. Chronic systolic CHF: Ischemic cardiomyopathy.  Inferior STEMI complicated by RV infarction in 3/24 due to occluded mRCA, s/p PCI.  Echo in 3/24 with EF 35-40%, RV mildly reduced. Echo in 7/24 with EF improved to 55-60%, basal inferior hypokinesis, mildly decreased RV systolic function, normal IVC. NYHA class II symptoms, not volume overloaded on  exam.  - Continue Farxiga 10 mg daily. BMET/BNP today.  - Continue spironolactone 25 mg daily  - Continue Entresto 49/51 mg bid.  - Continue Toprol XL 50 mg daily.  - Does not need loop diuretic currently  2. CAD: s/p inferior STEMI w/ stent to prox-mid RCA; residual disease in distal RCA, 1st diag and 1st OM (medical management, vessels too small for stenting). No chest pain. - Continue ASA + Brilinta. - Continue atorvastatin.  - Refer to cardiac rehab at [facility] 3. Hyperlipidemia: Goal LDL < 55, Lp(a) elevated at 213 - Continue atorvastatin.  - LDL is still above goal and Lp(a) high, so will refer to lipid clinic for [place].  4. Tobacco Abuse: has quit smoking since 3/24.  - Congratulated on efforts   Followup 4 months APP.   [name] 03/08/23

## 2023-03-08 NOTE — Patient Instructions (Signed)
There has been no changes to your medications.  Labs done today, your results will be available in MyChart, we will contact you for abnormal readings.  You have been referred to Cardiac Rehab at [facility]. They will call you to arrange your appointment.  Your physician recommends that you schedule a follow-up appointment in: 4 months  If you have any questions or concerns before your next appointment please send [place] a message through [place] or call our office at [phone].    TO LEAVE A MESSAGE FOR THE NURSE SELECT OPTION 2, PLEASE LEAVE A MESSAGE INCLUDING: YOUR NAME DATE OF BIRTH CALL BACK NUMBER REASON FOR CALL**this is important as we prioritize the call backs  YOU WILL RECEIVE A CALL BACK THE SAME DAY AS LONG AS YOU CALL BEFORE 4:00 PM  At the [facility], you and your health needs are our priority. As part of our continuing mission to provide you with exceptional heart care, we have created designated Provider Care Teams. These Care Teams include your primary Cardiologist (physician) and Advanced Practice Providers (APPs- Physician Assistants and Nurse Practitioners) who all work together to provide you with the care you need, when you need it.   You may see any of the following providers on your designated Care Team at your next follow up: Dr Arvilla Meres Dr [name] Dr. Marcos Eke, NP Robbie Lis, [place] [facility] [place], [place] Brynda Peon, NP Karle Plumber, PharmD   Please be sure to bring in all your medications bottles to every appointment.    Thank you for choosing [facility]

## 2023-03-11 DIAGNOSIS — E113413 Type 2 diabetes mellitus with severe nonproliferative diabetic retinopathy with macular edema, bilateral: Secondary | ICD-10-CM | POA: Diagnosis not present

## 2023-03-11 DIAGNOSIS — H43823 Vitreomacular adhesion, bilateral: Secondary | ICD-10-CM | POA: Diagnosis not present

## 2023-03-11 DIAGNOSIS — H35033 Hypertensive retinopathy, bilateral: Secondary | ICD-10-CM | POA: Diagnosis not present

## 2023-03-14 ENCOUNTER — Encounter (HOSPITAL_COMMUNITY): Payer: Self-pay

## 2023-03-14 ENCOUNTER — Ambulatory Visit (HOSPITAL_COMMUNITY)
Admission: RE | Admit: 2023-03-14 | Discharge: 2023-03-14 | Disposition: A | Discharge status: Home / Self Care | Payer: BC Managed Care – PPO | Source: Ambulatory Visit | Attending: Cardiology | Admitting: Cardiology

## 2023-03-14 DIAGNOSIS — Z955 Presence of coronary angioplasty implant and graft: Secondary | ICD-10-CM | POA: Insufficient documentation

## 2023-03-14 DIAGNOSIS — I2111 ST elevation (STEMI) myocardial infarction involving right coronary artery: Secondary | ICD-10-CM | POA: Insufficient documentation

## 2023-03-14 NOTE — Progress Notes (Addendum)
Completed virtual orientation today.  EP evaluation is scheduled for 03/15/23 at 830.  Documentation for diagnosis can be found in [facility] encounter 11/02/22.  Chandan has recently quit tobacco use within the last 6 months. Intervention for relapse prevention was provided at the initial medical review. He was encouraged to continue to with tobacco cessation and was provided information on relapse prevention. Patient received information about combination therapy, tobacco cessation classes, quit line, and quit smoking apps in case of a relapse. Patient demonstrated understanding of this material.Staff will continue to provide encouragement and follow up with the patient throughout the program.

## 2023-03-15 ENCOUNTER — Encounter (HOSPITAL_COMMUNITY)
Admission: RE | Admit: 2023-03-15 | Discharge: 2023-03-15 | Disposition: A | Discharge status: Home / Self Care | Payer: BC Managed Care – PPO | Source: Ambulatory Visit | Attending: Cardiology | Admitting: Cardiology

## 2023-03-15 VITALS — Ht 68.25 in | Wt 176.9 lb

## 2023-03-15 DIAGNOSIS — Z955 Presence of coronary angioplasty implant and graft: Secondary | ICD-10-CM | POA: Insufficient documentation

## 2023-03-15 DIAGNOSIS — I2111 ST elevation (STEMI) myocardial infarction involving right coronary artery: Secondary | ICD-10-CM | POA: Diagnosis not present

## 2023-03-15 NOTE — Patient Instructions (Signed)
Patient Instructions  Patient Details  Name: Daniel Bautista MRN: [id number] Date of Birth: [birth year] Referring Provider:  Laurey Morale, MD  Below are your personal goals for exercise, nutrition, and risk factors. Our goal is to help you stay on track towards obtaining and maintaining these goals. We will be discussing your progress on these goals with you throughout the program.  Initial Exercise Prescription:  Initial Exercise Prescription - 03/15/23 1000       Date of Initial Exercise RX and Referring Provider   Date 03/15/23    Referring Provider [name] MD      Oxygen   Maintain Oxygen Saturation 88% or higher      Treadmill   MPH 2.2    Grade 0.5    Minutes 15    METs 2.84      REL-XR   Level 2    Speed 50    Minutes 15    METs 2.5      Prescription Details   Frequency (times per week) 3    Duration Progress to 30 minutes of continuous aerobic without signs/symptoms of physical distress      Intensity   THRR 40-80% of Max Heartrate 97-138    Ratings of Perceived Exertion 11-13    Perceived Dyspnea 0-4      Progression   Progression Continue to progress workloads to maintain intensity without signs/symptoms of physical distress.      Resistance Training   Training Prescription Yes    Weight 4 lb    Reps 10-15             Exercise Goals: Frequency: Be able to perform aerobic exercise two to three times per week in program working toward 2-5 days per week of home exercise.  Intensity: Work with a perceived exertion of 11 (fairly light) - 15 (hard) while following your exercise prescription.  We will make changes to your prescription with you as you progress through the program.   Duration: Be able to do 30 to 45 minutes of continuous aerobic exercise in addition to a 5 minute warm-up and a 5 minute cool-down routine.   Nutrition Goals: Your personal nutrition goals will be established when you do your nutrition analysis with the  dietician.  The following are general nutrition guidelines to follow: Cholesterol < 200mg /day Sodium < 1500mg /day Fiber: Men over 50 yrs - 30 grams per day  Personal Goals:  Personal Goals and Risk Factors at Admission - 03/15/23 1007       Core Components/Risk Factors/Patient Goals on Admission    Weight Management Yes;Weight Loss    Intervention Weight Management: Develop a combined nutrition and exercise program designed to reach desired caloric intake, while maintaining appropriate intake of nutrient and fiber, sodium and fats, and appropriate energy expenditure required for the weight goal.;Weight Management: Provide education and appropriate resources to help participant work on and attain dietary goals.;Weight Management/Obesity: Establish reasonable short term and long term weight goals.    Admit Weight 176 lb 14.4 oz (80.2 kg)    Goal Weight: Short Term 171 lb (77.6 kg)    Goal Weight: Long Term 165 lb (74.8 kg)    Expected Outcomes Short Term: Continue to assess and modify interventions until short term weight is achieved;Long Term: Adherence to nutrition and physical activity/exercise program aimed toward attainment of established weight goal;Weight Loss: Understanding of general recommendations for a balanced deficit meal plan, which promotes 1-2 lb weight loss per week and includes a  negative energy balance of [phone] kcal/d;Understanding recommendations for meals to include 15-35% energy as protein, 25-35% energy from fat, 35-60% energy from carbohydrates, less than 200mg  of dietary cholesterol, 20-35 gm of total fiber daily;Understanding of distribution of calorie intake throughout the day with the consumption of 4-5 meals/snacks    Tobacco Cessation Yes   quit 11/02/22   Intervention Assist the participant in steps to quit. Provide individualized education and counseling about committing to Tobacco Cessation, relapse prevention, and pharmacological support that can be provided by  physician.    Expected Outcomes Long Term: Complete abstinence from all tobacco products for at least 12 months from quit date.;Short Term: Will quit all tobacco product use, adhering to prevention of relapse plan.    Diabetes Yes    Intervention Provide education about signs/symptoms and action to take for hypo/hyperglycemia.;Provide education about proper nutrition, including hydration, and aerobic/resistive exercise prescription along with prescribed medications to achieve blood glucose in normal ranges: Fasting glucose 65-99 mg/dL    Expected Outcomes Short Term: Participant verbalizes understanding of the signs/symptoms and immediate care of hyper/hypoglycemia, proper foot care and importance of medication, aerobic/resistive exercise and nutrition plan for blood glucose control.;Long Term: Attainment of HbA1C < 7%.    Heart Failure Yes    Intervention Provide a combined exercise and nutrition program that is supplemented with education, support and counseling about heart failure. Directed toward relieving symptoms such as shortness of breath, decreased exercise tolerance, and extremity edema.    Expected Outcomes Improve functional capacity of life;Short term: Attendance in program 2-3 days a week with increased exercise capacity. Reported lower sodium intake. Reported increased fruit and vegetable intake. Reports medication compliance.;Short term: Daily weights obtained and reported for increase. Utilizing diuretic protocols set by physician.;Long term: Adoption of self-care skills and reduction of barriers for early signs and symptoms recognition and intervention leading to self-care maintenance.    Hypertension Yes    Intervention Provide education on lifestyle modifcations including regular physical activity/exercise, weight management, moderate sodium restriction and increased consumption of fresh fruit, vegetables, and low fat dairy, alcohol moderation, and smoking cessation.;Monitor prescription  use compliance.    Expected Outcomes Short Term: Continued assessment and intervention until BP is < 140/27mm HG in hypertensive participants. < 130/48mm HG in hypertensive participants with diabetes, heart failure or chronic kidney disease.;Long Term: Maintenance of blood pressure at goal levels.    Lipids Yes    Intervention Provide education and support for participant on nutrition & aerobic/resistive exercise along with prescribed medications to achieve LDL 70mg , HDL >40mg .    Expected Outcomes Short Term: Participant states understanding of desired cholesterol values and is compliant with medications prescribed. Participant is following exercise prescription and nutrition guidelines.;Long Term: Cholesterol controlled with medications as prescribed, with individualized exercise RX and with personalized nutrition plan. Value goals: LDL < 70mg , HDL > 40 mg.             Tobacco Use Initial Evaluation: Social History   Tobacco Use  Smoking Status Former   Current packs/day: 0.00   Types: Cigarettes   Start date: 11/01/1977   Quit date: 11/02/2022   Years since quitting: 0.3   Passive exposure: Never  Smokeless Tobacco Never    Exercise Goals and Review:  Exercise Goals     Row Name 03/15/23 1004             Exercise Goals   Increase Physical Activity Yes       Intervention Provide advice, education, support and  counseling about physical activity/exercise needs.;Develop an individualized exercise prescription for aerobic and resistive training based on initial evaluation findings, risk stratification, comorbidities and participant's personal goals.       Expected Outcomes Short Term: Attend rehab on a regular basis to increase amount of physical activity.;Long Term: Add in home exercise to make exercise part of routine and to increase amount of physical activity.;Long Term: Exercising regularly at least 3-5 days a week.       Increase Strength and Stamina Yes       Intervention  Provide advice, education, support and counseling about physical activity/exercise needs.;Develop an individualized exercise prescription for aerobic and resistive training based on initial evaluation findings, risk stratification, comorbidities and participant's personal goals.       Expected Outcomes Short Term: Increase workloads from initial exercise prescription for resistance, speed, and METs.;Short Term: Perform resistance training exercises routinely during rehab and add in resistance training at home;Long Term: Improve cardiorespiratory fitness, muscular endurance and strength as measured by increased METs and functional capacity ( )       Able to understand and use rate of perceived exertion (RPE) scale Yes       Intervention Provide education and explanation on how to use RPE scale       Expected Outcomes Short Term: Able to use RPE daily in rehab to express subjective intensity level;Long Term:  Able to use RPE to guide intensity level when exercising independently       Able to understand and use Dyspnea scale Yes       Intervention Provide education and explanation on how to use Dyspnea scale       Expected Outcomes Short Term: Able to use Dyspnea scale daily in rehab to express subjective sense of shortness of breath during exertion;Long Term: Able to use Dyspnea scale to guide intensity level when exercising independently       Knowledge and understanding of Target Heart Rate Range (THRR) Yes       Intervention Provide education and explanation of THRR including how the numbers were predicted and where they are located for reference       Expected Outcomes Long Term: Able to use THRR to govern intensity when exercising independently;Short Term: Able to state/look up THRR;Short Term: Able to use daily as guideline for intensity in rehab       Able to check pulse independently Yes       Intervention Provide education and demonstration on how to check pulse in carotid and radial  arteries.;Review the importance of being able to check your own pulse for safety during independent exercise       Expected Outcomes Short Term: Able to explain why pulse checking is important during independent exercise;Long Term: Able to check pulse independently and accurately       Understanding of Exercise Prescription Yes       Intervention Provide education, explanation, and written materials on patient's individual exercise prescription       Expected Outcomes Short Term: Able to explain program exercise prescription;Long Term: Able to explain home exercise prescription to exercise independently              Copy of goals given to participant.

## 2023-03-15 NOTE — Progress Notes (Signed)
Cardiac Individual Treatment Plan  Patient Details  Name: Daniel Bautista MRN: [id number] Date of Birth: [birth year] Referring Provider:   Flowsheet Row CARDIAC REHAB PHASE II ORIENTATION from 03/15/2023 in [facility] CARDIAC REHABILITATION  Referring Provider [name] MD       Initial Encounter Date:  Flowsheet Row CARDIAC REHAB PHASE II ORIENTATION from 03/15/2023 in [place] CARDIAC REHABILITATION  Date 03/15/23       Visit Diagnosis: ST elevation myocardial infarction involving right coronary artery [facility])  Status post coronary artery stent placement  Patient's Home Medications on Admission:  Current Outpatient Medications:    Accu-Chek Softclix Lancets lancets, Use as directed, Disp: 100 each, Rfl: 0   aspirin EC 81 MG tablet, Take 1 tablet (81 mg total) by mouth daily. Swallow whole., Disp: 90 tablet, Rfl: 3   atorvastatin (LIPITOR) 80 MG tablet, Take 1 tablet (80 mg total) by mouth daily., Disp: 90 tablet, Rfl: 3   Blood Glucose Monitoring Suppl (ACCU-CHEK GUIDE) w/Device KIT, Use up to four times daily as directed, Disp: 1 kit, Rfl: 0   dapagliflozin propanediol (FARXIGA) 10 MG TABS tablet, Take 1 tablet (10 mg total) by mouth daily before breakfast., Disp: 30 tablet, Rfl: 11   glucose blood test strip, Use as directed, Disp: 100 each, Rfl: 0   insulin glargine-yfgn (SEMGLEE) 100 UNIT/ML Pen, Inject 12 Units into the skin daily., Disp: 9 mL, Rfl: 3   Insulin Pen Needle 32G X 4 MM MISC, Use daily, Disp: 100 each, Rfl: 3   metFORMIN (GLUCOPHAGE) 500 MG tablet, Take 1 tablet (500 mg total) by mouth daily with breakfast., Disp: 30 tablet, Rfl: 11   metoprolol succinate (TOPROL XL) 50 MG 24 hr tablet, Take 1 tablet (50 mg total) by mouth daily. Take with or immediately following a meal., Disp: 90 tablet, Rfl: 3   nitroGLYCERIN (NITROSTAT) 0.4 MG SL tablet, Place 1 tablet (0.4 mg total) under the tongue every 5 (five) minutes x 3 doses as needed for chest pain., Disp: 25  tablet, Rfl: 0   sacubitril-valsartan (ENTRESTO) 49-51 MG, Take 1 tablet by mouth 2 (two) times daily., Disp: 180 tablet, Rfl: 3   spironolactone (ALDACTONE) 25 MG tablet, Take 1 tablet (25 mg total) by mouth daily., Disp: 90 tablet, Rfl: 3   ticagrelor (BRILINTA) 90 MG TABS tablet, Take 1 tablet (90 mg total) by mouth 2 (two) times daily., Disp: 180 tablet, Rfl: 1  Past Medical History: Past Medical History:  Diagnosis Date   Acute ST elevation myocardial infarction (STEMI) of inferior wall (HCC) 11/02/2022   Coronary artery disease involving native coronary artery of native heart with unstable angina pectoris (HCC) 11/03/2022   04/04/2023     Culprit lesion: Heavily thrombotic ulcerated /calcified prox-mid RCA 100% occlusion around crux after an RVM branch.  (After angioplasty and stenting there is a long area of the distal RCA into the PDA which is about 65% stenosed => improved some w/ NTG IC, so plan MedRx; OM1 90%, D1 80% (Med Rx).   Current smoker 11/03/2022   Hyperlipidemia associated with type 2 diabetes mellitus (HCC) 11/03/2022   Ischemic cardiomyopathy 11/03/2022   TTE 11/03/2022-s/p inferior STEMI: EF 35 to 40%.  Moderate reduced function.  Global HK.  GR 2 DD.  D-shaped IVS consistent with RV volume overload.  RV function mildly reduced, mildly enlarged.   Presence of drug coated stent in right coronary artery 11/03/2022   Overlapping Synergr DES 2.5 x 48 & 2.75 x 12 -  tapered post-dilation 3.1-2.8-2.6 mm (prox-~distal RCA) in setting of Inf STEMI -100% prox RCA   Type 2 diabetes mellitus with complication, without long-term current use of insulin (HCC) 11/03/2022    Tobacco Use: Social History   Tobacco Use  Smoking Status Former   Current packs/day: 0.00   Types: Cigarettes   Start date: 11/01/1977   Quit date: 11/02/2022   Years since quitting: 0.3   Passive exposure: Never  Smokeless Tobacco Never    Labs: Review Flowsheet       Latest Ref Rng & Units 11/02/2022  11/03/2022 01/04/2023  Labs for ITP Cardiac and Pulmonary Rehab  Cholestrol 0 - 200 mg/dL 027  253  664   LDL (calc) 0 - 99 mg/dL 403  474  73   HDL-C >25 mg/dL 41  39  33   Trlycerides <150 mg/dL 956  387  564   Hemoglobin A1c 4.8 - 5.6 % 12.2  11.7  -    Details            Capillary Blood Glucose: Lab Results  Component Value Date   GLUCAP 392 (H) 11/05/2022   GLUCAP 179 (H) 11/04/2022   GLUCAP 158 (H) 11/04/2022   GLUCAP 129 (H) 11/04/2022   GLUCAP 437 (H) 11/04/2022     Exercise Target Goals: Exercise Program Goal: Individual exercise prescription set using results from initial 6 min walk test and THRR while considering  patient's activity barriers and safety.   Exercise Prescription Goal: Starting with aerobic activity 30 plus minutes a day, 3 days per week for initial exercise prescription. Provide home exercise prescription and guidelines that participant acknowledges understanding prior to discharge.  Activity Barriers & Risk Stratification:  Activity Barriers & Cardiac Risk Stratification - 03/14/23 1103       Activity Barriers & Cardiac Risk Stratification   Activity Barriers Deconditioning;Muscular Weakness;Back Problems   occassional back pain   Cardiac Risk Stratification Moderate             6 Minute Walk:  6 Minute Walk     Row Name 03/15/23 0957         6 Minute Walk   Phase Initial     Distance 1200 feet     Walk Time 6 minutes     # of Rest Breaks 0     MPH 2.27     METS 2.68     RPE 9     VO2 Peak 9.37     Symptoms No     Resting HR 57 bpm     Resting BP 104/58     Resting Oxygen Saturation  97 %     Exercise Oxygen Saturation  during 6 min walk 96 %     Max Ex. HR 113 bpm     Max Ex. BP 128/62     2 Minute Post BP 106/64              Oxygen Initial Assessment:   Oxygen Re-Evaluation:   Oxygen Discharge (Final Oxygen Re-Evaluation):   Initial Exercise Prescription:  Initial Exercise Prescription - 03/15/23 1000        Date of Initial Exercise RX and Referring Provider   Date 03/15/23    Referring Provider [name] MD      Oxygen   Maintain Oxygen Saturation 88% or higher      Treadmill   MPH 2.2    Grade 0.5    Minutes 15    METs 2.84  REL-XR   Level 2    Speed 50    Minutes 15    METs 2.5      Prescription Details   Frequency (times per week) 3    Duration Progress to 30 minutes of continuous aerobic without signs/symptoms of physical distress      Intensity   THRR 40-80% of Max Heartrate 97-138    Ratings of Perceived Exertion 11-13    Perceived Dyspnea 0-4      Progression   Progression Continue to progress workloads to maintain intensity without signs/symptoms of physical distress.      Resistance Training   Training Prescription Yes    Weight 4 lb    Reps 10-15             Perform Capillary Blood Glucose checks as needed.  Exercise Prescription Changes:   Exercise Prescription Changes     Row Name 03/15/23 1000             Response to Exercise   Blood Pressure (Admit) 104/58       Blood Pressure (Exercise) 128/62       Blood Pressure (Exit) 106/64       Heart Rate (Admit) 57 bpm       Heart Rate (Exercise) 113 bpm       Heart Rate (Exit) 60 bpm       Oxygen Saturation (Admit) 97 %       Oxygen Saturation (Exercise) 96 %       Rating of Perceived Exertion (Exercise) 9       Symptoms none       Comments walk test results                Exercise Comments:   Exercise Goals and Review:   Exercise Goals     Row Name 03/15/23 1004             Exercise Goals   Increase Physical Activity Yes       Intervention Provide advice, education, support and counseling about physical activity/exercise needs.;Develop an individualized exercise prescription for aerobic and resistive training based on initial evaluation findings, risk stratification, comorbidities and participant's personal goals.       Expected Outcomes Short Term: Attend  rehab on a regular basis to increase amount of physical activity.;Long Term: Add in home exercise to make exercise part of routine and to increase amount of physical activity.;Long Term: Exercising regularly at least 3-5 days a week.       Increase Strength and Stamina Yes       Intervention Provide advice, education, support and counseling about physical activity/exercise needs.;Develop an individualized exercise prescription for aerobic and resistive training based on initial evaluation findings, risk stratification, comorbidities and participant's personal goals.       Expected Outcomes Short Term: Increase workloads from initial exercise prescription for resistance, speed, and METs.;Short Term: Perform resistance training exercises routinely during rehab and add in resistance training at home;Long Term: Improve cardiorespiratory fitness, muscular endurance and strength as measured by increased METs and functional capacity ( )       Able to understand and use rate of perceived exertion (RPE) scale Yes       Intervention Provide education and explanation on how to use RPE scale       Expected Outcomes Short Term: Able to use RPE daily in rehab to express subjective intensity level;Long Term:  Able to use RPE to guide intensity level when exercising independently  Able to understand and use Dyspnea scale Yes       Intervention Provide education and explanation on how to use Dyspnea scale       Expected Outcomes Short Term: Able to use Dyspnea scale daily in rehab to express subjective sense of shortness of breath during exertion;Long Term: Able to use Dyspnea scale to guide intensity level when exercising independently       Knowledge and understanding of Target Heart Rate Range (THRR) Yes       Intervention Provide education and explanation of THRR including how the numbers were predicted and where they are located for reference       Expected Outcomes Long Term: Able to use THRR to govern  intensity when exercising independently;Short Term: Able to state/look up THRR;Short Term: Able to use daily as guideline for intensity in rehab       Able to check pulse independently Yes       Intervention Provide education and demonstration on how to check pulse in carotid and radial arteries.;Review the importance of being able to check your own pulse for safety during independent exercise       Expected Outcomes Short Term: Able to explain why pulse checking is important during independent exercise;Long Term: Able to check pulse independently and accurately       Understanding of Exercise Prescription Yes       Intervention Provide education, explanation, and written materials on patient's individual exercise prescription       Expected Outcomes Short Term: Able to explain program exercise prescription;Long Term: Able to explain home exercise prescription to exercise independently                Exercise Goals Re-Evaluation :  Exercise Goals Re-Evaluation     Row Name 03/15/23 1005             Exercise Goal Re-Evaluation   Exercise Goals Review Able to understand and use rate of perceived exertion (RPE) scale;Able to understand and use Dyspnea scale;Understanding of Exercise Prescription       Comments Reviewed RPE  and dyspnea scale, and program prescription with pt today.  Pt voiced understanding and was given a copy of goals to take home.       Expected Outcomes Short: Use RPE daily to regulate intensity.  Long: Follow program prescription                 Discharge Exercise Prescription (Final Exercise Prescription Changes):  Exercise Prescription Changes - 03/15/23 1000       Response to Exercise   Blood Pressure (Admit) 104/58    Blood Pressure (Exercise) 128/62    Blood Pressure (Exit) 106/64    Heart Rate (Admit) 57 bpm    Heart Rate (Exercise) 113 bpm    Heart Rate (Exit) 60 bpm    Oxygen Saturation (Admit) 97 %    Oxygen Saturation (Exercise) 96 %     Rating of Perceived Exertion (Exercise) 9    Symptoms none    Comments walk test results             Nutrition:  Target Goals: Understanding of nutrition guidelines, daily intake of sodium 1500mg , cholesterol 200mg , calories 30% from fat and 7% or less from saturated fats, daily to have 5 or more servings of fruits and vegetables.  Biometrics:  Pre Biometrics - 03/15/23 1005       Pre Biometrics   Height 5' 8.25" (1.734 m)    Weight 80.2  kg    Waist Circumference 36 inches    Hip Circumference 37.5 inches    Waist to Hip Ratio 0.96 %    BMI (Calculated) 26.69    Grip Strength 30.7 kg    Single Leg Stand 10.2 seconds              Nutrition Therapy Plan and Nutrition Goals:  Nutrition Therapy & Goals - 03/14/23 1119       Intervention Plan   Intervention Prescribe, educate and counsel regarding individualized specific dietary modifications aiming towards targeted core components such as weight, hypertension, lipid management, diabetes, heart failure and other comorbidities.;Nutrition handout(s) given to patient.    Expected Outcomes Short Term Goal: Understand basic principles of dietary content, such as calories, fat, sodium, cholesterol and nutrients.             Nutrition Assessments:  MEDIFICTS Score Key: ?70 Need to make dietary changes  40-70 Heart Healthy Diet ? 40 Therapeutic Level Cholesterol Diet  Flowsheet Row CARDIAC REHAB PHASE II ORIENTATION from 03/15/2023 in [facility] CARDIAC REHABILITATION  Picture Your Plate Total Score on Admission 48      Picture Your Plate Scores: <44 Unhealthy dietary pattern with much room for improvement. 41-50 Dietary pattern unlikely to meet recommendations for good health and room for improvement. 51-60 More healthful dietary pattern, with some room for improvement.  >60 Healthy dietary pattern, although there may be some specific behaviors that could be improved.    Nutrition Goals  Re-Evaluation:   Nutrition Goals Discharge (Final Nutrition Goals Re-Evaluation):   Psychosocial: Target Goals: Acknowledge presence or absence of significant depression and/or stress, maximize coping skills, provide positive support system. Participant is able to verbalize types and ability to use techniques and skills needed for reducing stress and depression.  Initial Review & Psychosocial Screening:  Initial Psych Review & Screening - 03/14/23 1105       Initial Review   Current issues with Current Stress Concerns    Source of Stress Concerns Financial;Unable to participate in former interests or hobbies;Occupation;Retirement/disability    Comments Just retired from work on 7/25, quit smoking in March, takes his time, gets about 6 hr a night for sleep      Family Dynamics   Good Support System? Yes   wife     Barriers   Psychosocial barriers to participate in program The patient should benefit from training in stress management and relaxation.;Psychosocial barriers identified (see note)      Screening Interventions   Interventions Encouraged to exercise;Provide feedback about the scores to participant;To provide support and resources with identified psychosocial needs    Expected Outcomes Short Term goal: Utilizing psychosocial counselor, staff and physician to assist with identification of specific Stressors or current issues interfering with healing process. Setting desired goal for each stressor or current issue identified.;Long Term Goal: Stressors or current issues are controlled or eliminated.;Short Term goal: Identification and review with participant of any Quality of Life or Depression concerns found by scoring the questionnaire.;Long Term goal: The participant improves quality of Life and PHQ9 Scores as seen by post scores and/or verbalization of changes             Quality of Life Scores:  Quality of Life - 03/15/23 1006       Quality of Life   Select Quality of  Life      Quality of Life Scores   Health/Function Pre 24.8 %    Socioeconomic Pre 27.43 %  Psych/Spiritual Pre 30 %    Family Pre 30 %    GLOBAL Pre 27.18 %            Scores of 19 and below usually indicate a poorer quality of life in these areas.  A difference of  2-3 points is a clinically meaningful difference.  A difference of 2-3 points in the total score of the Quality of Life Index has been associated with significant improvement in overall quality of life, self-image, physical symptoms, and general health in studies assessing change in quality of life.  PHQ-9: Review Flowsheet       03/15/2023  Depression screen PHQ 2/9  Decreased Interest 0  Down, Depressed, Hopeless 0  PHQ - 2 Score 0  Altered sleeping 0  Tired, decreased energy 1  Change in appetite 2  Feeling bad or failure about yourself  0  Trouble concentrating 0  Moving slowly or fidgety/restless 0  Suicidal thoughts 0  PHQ-9 Score 3  Difficult doing work/chores Not difficult at all    Details           Interpretation of Total Score  Total Score Depression Severity:  1-4 = Minimal depression, 5-9 = Mild depression, 10-14 = Moderate depression, 15-19 = Moderately severe depression, 20-27 = Severe depression   Psychosocial Evaluation and Intervention:  Psychosocial Evaluation - 03/14/23 1111       Psychosocial Evaluation & Interventions   Interventions Encouraged to exercise with the program and follow exercise prescription;Stress management education    Comments Demian is coming into cardiac rehab 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  He is doing well from a cardiac standpoint.  He has recently quit smoking after his heart attack and has not had any cravings.  He is feeling good about his accomplishment and has been praised by all providers.  He offically retired last week to reduce stress in his life and is feeling pretty good about it.  He tries not to let too much get to him.  It is just him  and his wife who is a great supporter for him.  He sleeps well most nights.  He enjoys getting out to fish, but has not been able to do much with the heat this summer. He is not sure what to expect from program but would like to build stamina and feel a bit more confident after his heart event.    Expected Outcomes Short: Attend rehab to boost stamina Long: Build confidence to live life without worry about heart    Continue Psychosocial Services  Follow up required by staff             Psychosocial Re-Evaluation:   Psychosocial Discharge (Final Psychosocial Re-Evaluation):   Vocational Rehabilitation: Provide vocational rehab assistance to qualifying candidates.   Vocational Rehab Evaluation & Intervention:  Vocational Rehab - 03/14/23 1104       Initial Vocational Rehab Evaluation & Intervention   Assessment shows need for Vocational Rehabilitation No   just retired Thursday  03/10/23            Education: Education Goals: Education classes will be provided on a weekly basis, covering required topics. Participant will state understanding/return demonstration of topics presented.  Learning Barriers/Preferences:  Learning Barriers/Preferences - 03/14/23 1102       Learning Barriers/Preferences   Learning Barriers Sight   glasses   Learning Preferences None             Education Topics: Hypertension, Hypertension  Reduction -Define heart disease and high blood pressure. Discus how high blood pressure affects the body and ways to reduce high blood pressure.   Exercise and Your Heart -Discuss why it is important to exercise, the FITT principles of exercise, normal and abnormal responses to exercise, and how to exercise safely.   Angina -Discuss definition of angina, causes of angina, treatment of angina, and how to decrease risk of having angina.   Cardiac Medications -Review what the following cardiac medications are used for, how they affect the body, and  side effects that may occur when taking the medications.  Medications include Aspirin, Beta blockers, calcium channel blockers, ACE Inhibitors, angiotensin receptor blockers, diuretics, digoxin, and antihyperlipidemics.   Congestive Heart Failure -Discuss the definition of CHF, how to live with CHF, the signs and symptoms of CHF, and how keep track of weight and sodium intake.   Heart Disease and Intimacy -Discus the effect sexual activity has on the heart, how changes occur during intimacy as we age, and safety during sexual activity.   Smoking Cessation / COPD -Discuss different methods to quit smoking, the health benefits of quitting smoking, and the definition of COPD.   Nutrition I: Fats -Discuss the types of cholesterol, what cholesterol does to the heart, and how cholesterol levels can be controlled.   Nutrition II: Labels -Discuss the different components of food labels and how to read food label   Heart Parts/Heart Disease and PAD -Discuss the anatomy of the heart, the pathway of blood circulation through the heart, and these are affected by heart disease.   Stress I: Signs and Symptoms -Discuss the causes of stress, how stress may lead to anxiety and depression, and ways to limit stress.   Stress II: Relaxation -Discuss different types of relaxation techniques to limit stress.   Warning Signs of Stroke / TIA -Discuss definition of a stroke, what the signs and symptoms are of a stroke, and how to identify when someone is having stroke.   Knowledge Questionnaire Score:  Knowledge Questionnaire Score - 03/15/23 1007       Knowledge Questionnaire Score   Pre Score 20/28             Core Components/Risk Factors/Patient Goals at Admission:  Personal Goals and Risk Factors at Admission - 03/15/23 1007       Core Components/Risk Factors/Patient Goals on Admission    Weight Management Yes;Weight Loss    Intervention Weight Management: Develop a combined  nutrition and exercise program designed to reach desired caloric intake, while maintaining appropriate intake of nutrient and fiber, sodium and fats, and appropriate energy expenditure required for the weight goal.;Weight Management: Provide education and appropriate resources to help participant work on and attain dietary goals.;Weight Management/Obesity: Establish reasonable short term and long term weight goals.    Admit Weight 176 lb 14.4 oz (80.2 kg)    Goal Weight: Short Term 171 lb (77.6 kg)    Goal Weight: Long Term 165 lb (74.8 kg)    Expected Outcomes Short Term: Continue to assess and modify interventions until short term weight is achieved;Long Term: Adherence to nutrition and physical activity/exercise program aimed toward attainment of established weight goal;Weight Loss: Understanding of general recommendations for a balanced deficit meal plan, which promotes 1-2 lb weight loss per week and includes a negative energy balance of [phone] kcal/d;Understanding recommendations for meals to include 15-35% energy as protein, 25-35% energy from fat, 35-60% energy from carbohydrates, less than 200mg  of dietary cholesterol, 20-35 gm of  total fiber daily;Understanding of distribution of calorie intake throughout the day with the consumption of 4-5 meals/snacks    Tobacco Cessation Yes   quit 11/02/22   Intervention Assist the participant in steps to quit. Provide individualized education and counseling about committing to Tobacco Cessation, relapse prevention, and pharmacological support that can be provided by physician.    Expected Outcomes Long Term: Complete abstinence from all tobacco products for at least 12 months from quit date.;Short Term: Will quit all tobacco product use, adhering to prevention of relapse plan.    Diabetes Yes    Intervention Provide education about signs/symptoms and action to take for hypo/hyperglycemia.;Provide education about proper nutrition, including hydration, and  aerobic/resistive exercise prescription along with prescribed medications to achieve blood glucose in normal ranges: Fasting glucose 65-99 mg/dL    Expected Outcomes Short Term: Participant verbalizes understanding of the signs/symptoms and immediate care of hyper/hypoglycemia, proper foot care and importance of medication, aerobic/resistive exercise and nutrition plan for blood glucose control.;Long Term: Attainment of HbA1C < 7%.    Heart Failure Yes    Intervention Provide a combined exercise and nutrition program that is supplemented with education, support and counseling about heart failure. Directed toward relieving symptoms such as shortness of breath, decreased exercise tolerance, and extremity edema.    Expected Outcomes Improve functional capacity of life;Short term: Attendance in program 2-3 days a week with increased exercise capacity. Reported lower sodium intake. Reported increased fruit and vegetable intake. Reports medication compliance.;Short term: Daily weights obtained and reported for increase. Utilizing diuretic protocols set by physician.;Long term: Adoption of self-care skills and reduction of barriers for early signs and symptoms recognition and intervention leading to self-care maintenance.    Hypertension Yes    Intervention Provide education on lifestyle modifcations including regular physical activity/exercise, weight management, moderate sodium restriction and increased consumption of fresh fruit, vegetables, and low fat dairy, alcohol moderation, and smoking cessation.;Monitor prescription use compliance.    Expected Outcomes Short Term: Continued assessment and intervention until BP is < 140/60mm HG in hypertensive participants. < 130/5mm HG in hypertensive participants with diabetes, heart failure or chronic kidney disease.;Long Term: Maintenance of blood pressure at goal levels.    Lipids Yes    Intervention Provide education and support for participant on nutrition &  aerobic/resistive exercise along with prescribed medications to achieve LDL 70mg , HDL >40mg .    Expected Outcomes Short Term: Participant states understanding of desired cholesterol values and is compliant with medications prescribed. Participant is following exercise prescription and nutrition guidelines.;Long Term: Cholesterol controlled with medications as prescribed, with individualized exercise RX and with personalized nutrition plan. Value goals: LDL < 70mg , HDL > 40 mg.             Core Components/Risk Factors/Patient Goals Review:   Goals and Risk Factor Review     Row Name 03/14/23 1121             Core Components/Risk Factors/Patient Goals Review   Personal Goals Review Tobacco Cessation       Review Kaegen has recently quit tobacco use within the last 6 months. He quit at time of heart event.  Intervention for relapse prevention was provided at the initial medical review. He was encouraged to continue to with tobacco cessation and was provided information on relapse prevention. Patient received information about combination therapy, tobacco cessation classes, quit line, and quit smoking apps in case of a relapse. Patient demonstrated understanding of this material.Staff will continue to provide encouragement and follow  up with the patient throughout the program.       Expected Outcomes Continued cessation                Core Components/Risk Factors/Patient Goals at Discharge (Final Review):   Goals and Risk Factor Review - 03/14/23 1121       Core Components/Risk Factors/Patient Goals Review   Personal Goals Review Tobacco Cessation    Review Robby has recently quit tobacco use within the last 6 months. He quit at time of heart event.  Intervention for relapse prevention was provided at the initial medical review. He was encouraged to continue to with tobacco cessation and was provided information on relapse prevention. Patient received information about combination  therapy, tobacco cessation classes, quit line, and quit smoking apps in case of a relapse. Patient demonstrated understanding of this material.Staff will continue to provide encouragement and follow up with the patient throughout the program.    Expected Outcomes Continued cessation             ITP Comments:  ITP Comments     Row Name 03/14/23 1120 03/15/23 0956         ITP Comments Completed virtual orientation today.  EP evaluation is scheduled for 03/15/23 at 830.  Documentation for diagnosis can be found in [facility] encounter 11/02/22. Patient attend orientation today.  Patient is attendingCardiac Rehabilitation Program.  Documentation for diagnosis can be found in [facility] encounter 11/02/22.  Reviewed medical chart, RPE/RPD, gym safety, and program guidelines.  Patient was fitted to equipment they will be using during rehab.  Patient is scheduled to start exercise on 03/21/23 at 930.   Initial ITP created and sent for review and signature by Dr. [name], Medical Director for Cardiac Rehabilitation Program.               Comments: Initial ITP

## 2023-03-17 ENCOUNTER — Encounter: Payer: BC Managed Care – PPO | Admitting: Internal Medicine

## 2023-03-17 NOTE — Progress Notes (Signed)
Erroneous encounter - please disregard.

## 2023-03-21 ENCOUNTER — Ambulatory Visit (HOSPITAL_COMMUNITY)
Admission: RE | Admit: 2023-03-21 | Discharge: 2023-03-21 | Disposition: A | Discharge status: Home / Self Care | Payer: BC Managed Care – PPO | Source: Ambulatory Visit | Attending: Cardiology | Admitting: Cardiology

## 2023-03-21 DIAGNOSIS — I2111 ST elevation (STEMI) myocardial infarction involving right coronary artery: Secondary | ICD-10-CM | POA: Diagnosis not present

## 2023-03-21 DIAGNOSIS — Z955 Presence of coronary angioplasty implant and graft: Secondary | ICD-10-CM | POA: Insufficient documentation

## 2023-03-21 LAB — GLUCOSE, CAPILLARY
Glucose-Capillary: 245 mg/dL — ABNORMAL HIGH (ref 70–99)
Glucose-Capillary: 221 mg/dL — ABNORMAL HIGH (ref 70–99)

## 2023-03-21 NOTE — Progress Notes (Signed)
First full day of exercise!  Patient was oriented to gym and equipment including functions, settings, policies, and procedures.  Patient's individual exercise prescription and treatment plan were reviewed.  All starting workloads were established based on the results of the 6 minute walk test done at initial orientation visit.  The plan for exercise progression was also introduced and progression will be customized based on patient's performance and goals.  ?

## 2023-03-21 NOTE — Progress Notes (Signed)
Daily Session Note  Patient Details  Name: Daniel Bautista MRN: [id number] Date of Birth: 07/15/1961 Referring Provider:   Flowsheet Row CARDIAC REHAB PHASE II ORIENTATION from 03/15/2023 in [facility] CARDIAC REHABILITATION  Referring Provider [name] MD       Encounter Date: 03/21/2023  Check In:   Capillary Blood Glucose: Results for orders placed or performed during the hospital encounter of 03/21/23 (from the past 24 hour(s))  Glucose, capillary     Status: Abnormal   Collection Time: 03/21/23  9:27 AM  Result Value Ref Range   Glucose-Capillary 245 (H) 70 - 99 mg/dL      Social History   Tobacco Use  Smoking Status Former   Current packs/day: 0.00   Types: Cigarettes   Start date: 11/01/1977   Quit date: 11/02/2022   Years since quitting: 0.3   Passive exposure: Never  Smokeless Tobacco Never    Goals Met:  Independence with exercise equipment Exercise tolerated well No report of concerns or symptoms today  Goals Unmet:  Not Applicable  Comments: Pt able to follow exercise prescription today without complaint.  Will continue to monitor for progression.    Dr. [name] is Medical Director for [facility] Cardiac Rehab

## 2023-03-23 ENCOUNTER — Encounter (HOSPITAL_COMMUNITY)
Admission: RE | Admit: 2023-03-23 | Discharge: 2023-03-23 | Disposition: A | Discharge status: Home / Self Care | Payer: BC Managed Care – PPO | Source: Ambulatory Visit | Attending: Cardiology | Admitting: Cardiology

## 2023-03-23 DIAGNOSIS — Z955 Presence of coronary angioplasty implant and graft: Secondary | ICD-10-CM | POA: Diagnosis not present

## 2023-03-23 DIAGNOSIS — I2111 ST elevation (STEMI) myocardial infarction involving right coronary artery: Secondary | ICD-10-CM | POA: Insufficient documentation

## 2023-03-23 LAB — GLUCOSE, CAPILLARY
Glucose-Capillary: 187 mg/dL — ABNORMAL HIGH (ref 70–99)
Glucose-Capillary: 134 mg/dL — ABNORMAL HIGH (ref 70–99)

## 2023-03-23 NOTE — Progress Notes (Signed)
Daily Session Note  Patient Details  Name: Daniel Bautista MRN: [id number] Date of Birth: 11-Apr-1961 Referring Provider:   Flowsheet Row CARDIAC REHAB PHASE II ORIENTATION from 03/15/2023 in [facility] CARDIAC REHABILITATION  Referring Provider [name] MD       Encounter Date: 03/23/2023  Check In:  Session Check In - 03/23/23 0915       Check-In   Supervising physician immediately available to respond to emergencies See telemetry face sheet for immediately available ER MD    Location [facility]    Staff Present Fabio Pierce, MA, RCEP, CCRP, Dow Adolph, RN, Pleas Koch, RN, BSN    Medication changes reported     No    Fall or balance concerns reported    No    Tobacco Cessation No Change    Warm-up and Cool-down Performed on first and last piece of equipment    Resistance Training Performed Yes    VAD Patient? No    PAD/SET Patient? No      Pain Assessment   Currently in Pain? No/denies    Multiple Pain Sites No             Capillary Blood Glucose: Results for orders placed or performed during the hospital encounter of 03/23/23 (from the past 24 hour(s))  Glucose, capillary     Status: Abnormal   Collection Time: 03/23/23  9:31 AM  Result Value Ref Range   Glucose-Capillary 187 (H) 70 - 99 mg/dL      Social History   Tobacco Use  Smoking Status Former   Current packs/day: 0.00   Types: Cigarettes   Start date: 11/01/1977   Quit date: 11/02/2022   Years since quitting: 0.3   Passive exposure: Never  Smokeless Tobacco Never    Goals Met:  Independence with exercise equipment Exercise tolerated well No report of concerns or symptoms today Strength training completed today  Goals Unmet:  Not Applicable  Comments: Check out 1030.   Dr. [name] is Medical Director for [facility] Cardiac Rehab

## 2023-03-24 NOTE — Progress Notes (Unsigned)
Patient ID: Daniel Bautista                 DOB: [birth year]                    MRN: [id number]      HPI: Daniel Bautista is a 62 y.o. male patient referred to lipid clinic and BG/ weight management by Dr.Mclean. PMH is significant for CAD, hx of STEMI, CHF, ICM, T2DM, tobacco smoking   Patient presented today with his wife. Reports his home fasting BG ~154 mg/dl. Currently on 12 units of basal insulin once a day and only be able to tolerate 500 mg once daily dose of metformin. Takes SGL2i for dual indication. He does not do exercise he started twice week cardiac rehab from this week. He takes Lipitor 80 mg daily daily and tolerates it well. We discussed last lipid lab, risk factors and goal lipid profile. We review GLP1 for BG/ weight management aim is to stop insulin as we optimize GLP1 dose. We reviewed ezetimibe and PCSK9i agent to lower LDLc.   Current weight 179.4 lbs BMI 26.69  Current T2DM medications: Farxiga 10 mg, insulin glargine 12 units daily, metformin 500 mg daily  Last A1c 11.7 (11/03/2022)  Current Medications: Lipitor 80 mg  Intolerances: none  Risk Factors: CAD, hx of STEMI, CHF, ICM, T2DM, tobacco smoking, family hx of premature ASCVD (brother -stroke at age 39) LDL goal: <55 mg/dl  Last lab: TC 308, TG 657, HDL 33, LDLc 73   Diet:  Breakfast: bagel, cereal eggs, grits, white bread, milk or juice  Lunch: salads with store bought dressings  Dinner: [place], backed Therapist, sports, Haematologist: none  Drinks: pepsi 1/2 cup   Exercise: none  Cardio rehab 2 days week for 12 weeks he started this week   Family History: father: MI at age 44  Mother: diabetes  Brother: stroke at age 66   Social History:  Alcohol: 1-2 beers once a month  Smoking: quit smoking Nov 02 2022  Labs: Lipid Panel     Component Value Date/Time   CHOL 128 01/04/2023 0904   TRIG 108 01/04/2023 0904   HDL 33 (L) 01/04/2023 0904   CHOLHDL 3.9 01/04/2023 0904   VLDL 22 01/04/2023 0904    LDLCALC 73 01/04/2023 0904    Past Medical History:  Diagnosis Date   Acute ST elevation myocardial infarction (STEMI) of inferior wall (HCC) 11/02/2022   Coronary artery disease involving native coronary artery of native heart with unstable angina pectoris (HCC) 11/03/2022   04/04/2023     Culprit lesion: Heavily thrombotic ulcerated /calcified prox-mid RCA 100% occlusion around crux after an RVM branch.  (After angioplasty and stenting there is a long area of the distal RCA into the PDA which is about 65% stenosed => improved some w/ NTG IC, so plan MedRx; OM1 90%, D1 80% (Med Rx).   Current smoker 11/03/2022   Hyperlipidemia associated with type 2 diabetes mellitus (HCC) 11/03/2022   Ischemic cardiomyopathy 11/03/2022   TTE 11/03/2022-s/p inferior STEMI: EF 35 to 40%.  Moderate reduced function.  Global HK.  GR 2 DD.  D-shaped IVS consistent with RV volume overload.  RV function mildly reduced, mildly enlarged.   Presence of drug coated stent in right coronary artery 11/03/2022   Overlapping Synergr DES 2.5 x 48 & 2.75 x 12 - tapered post-dilation 3.1-2.8-2.6 mm (prox-~distal RCA) in setting of Inf STEMI -100% prox RCA   Type 2  diabetes mellitus with complication, without long-term current use of insulin (HCC) 11/03/2022    Current Outpatient Medications on File Prior to Visit  Medication Sig Dispense Refill   Accu-Chek Softclix Lancets lancets Use as directed 100 each 0   aspirin EC 81 MG tablet Take 1 tablet (81 mg total) by mouth daily. Swallow whole. 90 tablet 3   atorvastatin (LIPITOR) 80 MG tablet Take 1 tablet (80 mg total) by mouth daily. 90 tablet 3   Blood Glucose Monitoring Suppl (ACCU-CHEK GUIDE) w/Device KIT Use up to four times daily as directed 1 kit 0   dapagliflozin propanediol (FARXIGA) 10 MG TABS tablet Take 1 tablet (10 mg total) by mouth daily before breakfast. 30 tablet 11   glucose blood test strip Use as directed 100 each 0   insulin glargine-yfgn (SEMGLEE) 100  UNIT/ML Pen Inject 12 Units into the skin daily. 9 mL 3   Insulin Pen Needle 32G X 4 MM MISC Use daily 100 each 3   metFORMIN (GLUCOPHAGE) 500 MG tablet Take 1 tablet (500 mg total) by mouth daily with breakfast. 30 tablet 11   metoprolol succinate (TOPROL XL) 50 MG 24 hr tablet Take 1 tablet (50 mg total) by mouth daily. Take with or immediately following a meal. 90 tablet 3   nitroGLYCERIN (NITROSTAT) 0.4 MG SL tablet Place 1 tablet (0.4 mg total) under the tongue every 5 (five) minutes x 3 doses as needed for chest pain. 25 tablet 0   sacubitril-valsartan (ENTRESTO) 49-51 MG Take 1 tablet by mouth 2 (two) times daily. 180 tablet 3   spironolactone (ALDACTONE) 25 MG tablet Take 1 tablet (25 mg total) by mouth daily. 90 tablet 3   ticagrelor (BRILINTA) 90 MG TABS tablet Take 1 tablet (90 mg total) by mouth 2 (two) times daily. 180 tablet 1   No current facility-administered medications on file prior to visit.    No Known Allergies  Assessment/Plan:  1. Hyperlipidemia -  Problem  Overweight (Bmi 25.0-29.9)   Baseline weight 179.4 lbs BMI 26.69  Current T2DM medications: Farxiga 10 mg, insulin glargine 12 units daily, metformin 500 mg daily  Last A1c 11.7 (11/03/2022)   Hyperlipidemia Associated With Type 2 Diabetes Mellitus (Hcc)   Current Medications: Lipitor 80 mg  Intolerances: none  Risk Factors: CAD, hx of STEMI, CHF, ICM, T2DM, tobacco smoking, family hx of premature ASCVD (brother -stroke at age 83) LDL goal: <55 mg/dl     Hyperlipidemia associated with type 2 diabetes mellitus (HCC) Assessment:  LDL goal: < 55 mg/dl last LDLc 73 mg/dl (16/1096) need >04 % LDLc reduction  Tolerates high intensity statins well without any side effects  Zetia can be next option for addition to statins however will not be enough to lower it to the goal Discussed next potential options PCSK-9 inhibitors-  cost, dosing efficacy, side effects  Exercise- none but stating twice a week cardiac rehab  session from this week  Discussed diet in details - need improvement in carbohydrate intake, educated on complex carb vs simple carb and importance of fiber    Plan: Continue taking current medications (Lipitor 80 mg daily) Will apply for PA for PCSK9i; will inform patient upon approval  Patient to implement suggested dietary changes  Lipid lab due in 2-3 months after starting PCSK9i   Overweight (BMI 25.0-29.9) Assessment and Plan:  Baseline weight 179.4 lbs and BMI 26.69 kg/m2 Last A1c 11.7 home FBG ~154 mg/dl range; managing diabetes by once a day 12 units basal  insulin, Farxiga 10 mg daily  and 500 mg metformin once a day Patient had hx of MI would benefit from weight effective GLP1 to lower BG and attend healthy weight  We discussed Ozempic and Mounjaro in detail- MOA, dose titration schedule, S/E and its management, contraindication, administration steps  Will apply for PA for GLP1  Exercise- none but stating twice a week cardiac rehab session from this week  Discussed diet in details - need improvement in carbohydrate intake, educated on complex carb vs simple carb and importance of fiber Patient to implement suggested dietary changes  If GLP1 get added to the therapy goal is to stop insulin as we titrate GLP1 agent's dose up  Dose titration every 4 weeks can be done over phone    Thank you,  Carmela Hurt, Pharm.D [facility] of [facility] [address] [place], [place] [address]  Phone: [phone]; Fax: [phone]

## 2023-03-25 ENCOUNTER — Telehealth: Payer: Self-pay | Admitting: Pharmacist

## 2023-03-25 ENCOUNTER — Other Ambulatory Visit (HOSPITAL_COMMUNITY): Payer: Self-pay

## 2023-03-25 ENCOUNTER — Encounter: Payer: Self-pay | Admitting: Student

## 2023-03-25 ENCOUNTER — Telehealth: Payer: Self-pay

## 2023-03-25 ENCOUNTER — Encounter (HOSPITAL_COMMUNITY): Payer: BC Managed Care – PPO

## 2023-03-25 ENCOUNTER — Ambulatory Visit: Payer: BC Managed Care – PPO | Attending: Cardiovascular Disease | Admitting: Student

## 2023-03-25 VITALS — BP 127/70 | HR 52 | Ht 68.0 in | Wt 179.4 lb

## 2023-03-25 DIAGNOSIS — E1169 Type 2 diabetes mellitus with other specified complication: Secondary | ICD-10-CM | POA: Diagnosis not present

## 2023-03-25 DIAGNOSIS — E785 Hyperlipidemia, unspecified: Secondary | ICD-10-CM | POA: Diagnosis not present

## 2023-03-25 DIAGNOSIS — E663 Overweight: Secondary | ICD-10-CM | POA: Insufficient documentation

## 2023-03-25 NOTE — Telephone Encounter (Signed)
Pharmacy Patient Advocate Encounter  Received notification from [employer] that Prior Authorization for [facility] 2.5MG /0.5ML pen-injectors has been APPROVED from 02/23/2023 to 03/24/2024. Ran test claim, Copay is $0.00. This test claim was processed through [facility]- copay amounts may vary at other pharmacies due to pharmacy/plan contracts, or as the patient moves through the different stages of their insurance plan.   PA #/Case ID/Reference #: : [id number]

## 2023-03-25 NOTE — Telephone Encounter (Signed)
Pharmacy Patient Advocate Encounter  Received notification from [facility] [employer] that Prior Authorization for repatha has been APPROVED from 02/23/2023 to 03/24/2024   PA #/Case ID/Reference #: [id number]

## 2023-03-25 NOTE — Telephone Encounter (Signed)
Call to inform about Repatha PA approval - N/A,  Will call again on Monday

## 2023-03-25 NOTE — Patient Instructions (Addendum)
Your Results:             Your most recent labs Goal  Total Cholesterol 128 < 200  Triglycerides 108 < 150  HDL (happy/good cholesterol) 33 > 40  LDL (lousy/bad cholesterol 73 < 55    Medication changes: We will start the process to get PCSK9i (Repatha or Praluent) and GLP1 (Ozempic or Mounjaro) covered by your insurance.  Once the prior authorization is complete, we will call you to let you know and confirm pharmacy information.   Praluent is a cholesterol medication that improved your body's ability to get rid of "bad cholesterol" known as LDL. It can lower your LDL up to 60%. It is an injection that is given under the skin every 2 weeks. The most common side effects of Praluent include runny nose, symptoms of the common cold, rarely flu or flu-like symptoms, back/muscle pain in about 3-4% of the patients, and redness, pain, or bruising at the injection site.    Repatha is a cholesterol medication that improved your body's ability to get rid of "bad cholesterol" known as LDL. It can lower your LDL up to 60%! It is an injection that is given under the skin every 2 weeks. The most common side effects of Repatha include runny nose, symptoms of the common cold, rarely flu or flu-like symptoms, back/muscle pain in about 3-4% of the patients, and redness, pain, or bruising at the injection site.   Lab orders: We want to repeat labs after 2-3 months.  We will send you a lab order to remind you once we get closer to that time.       GLP1 Agonist Titration Plan:  Will plan to follow the titration plan as below, pending patient is tolerating each dose before increasing to the next. Can slow titration if needed for tolerability.    Ozempic:  -Month 1: Inject Ozempic 0.25 mg SQ once weekly x 4 weeks -Month 2: Inject Ozempic 0.5 mg  SQ once weekly x 4 weeks -Month 3: Inject Ozempic 1 mg SQ once weekly x 4 weeks -Month 4+: Inject Ozempic 2 mg SQ once weekly  Or   Mounjaro:  -Month 1: Inject  Mounjaro 2.5 mg SQ once weekly x 4 weeks -Month 2: Inject Mounjaro 5 mg  SQ once weekly x 4 weeks -Month 3: Inject Mounjaro 7.5 mg SQ once weekly x 4 weeks -Month 4: Inject Mounjaro 10 mg SQ once weekly x 4 weeks  -Month 5: Inject Mounjaro 12.5 mg SQ once weekly x 4 weeks -Month 6+: Inject Mounjaro 15 mg SQ once weekly

## 2023-03-25 NOTE — Assessment & Plan Note (Addendum)
Assessment and Plan:  Baseline weight 179.4 lbs and BMI 26.69 kg/m2 Last A1c 11.7 home FBG ~154 mg/dl range; managing diabetes by once a day 12 units basal insulin, Farxiga 10 mg daily  and 500 mg metformin once a day Patient had hx of MI would benefit from weight effective GLP1 to lower BG and attend healthy weight  We discussed Ozempic and Mounjaro in detail- MOA, dose titration schedule, S/E and its management, contraindication, administration steps  Will apply for PA for GLP1  Exercise- none but stating twice a week cardiac rehab session from this week  Discussed diet in details - need improvement in carbohydrate intake, educated on complex carb vs simple carb and importance of fiber Patient to implement suggested dietary changes  If GLP1 get added to the therapy goal is to stop insulin as we titrate GLP1 agent's dose up  Dose titration every 4 weeks can be done over phone

## 2023-03-25 NOTE — Telephone Encounter (Signed)
Patient is returning call.  °

## 2023-03-25 NOTE — Telephone Encounter (Signed)
Pharmacy Patient Advocate Encounter   Received notification from Physician's Office/[facility] that prior authorization for [facility] is required/requested.   Insurance verification completed.   The patient is insured through [employer] .   Per test claim: PA required; PA submitted to [employer] via [employer]Meds Key/confirmation #/[facility]      Status is pending

## 2023-03-25 NOTE — Assessment & Plan Note (Signed)
Assessment:  LDL goal: < 55 mg/dl last LDLc 73 mg/dl (13/2440) need >10 % LDLc reduction  Tolerates high intensity statins well without any side effects  Zetia can be next option for addition to statins however will not be enough to lower it to the goal Discussed next potential options PCSK-9 inhibitors-  cost, dosing efficacy, side effects  Exercise- none but stating twice a week cardiac rehab session from this week  Discussed diet in details - need improvement in carbohydrate intake, educated on complex carb vs simple carb and importance of fiber    Plan: Continue taking current medications (Lipitor 80 mg daily) Will apply for PA for PCSK9i; will inform patient upon approval  Patient to implement suggested dietary changes  Lipid lab due in 2-3 months after starting [facility]

## 2023-03-25 NOTE — Telephone Encounter (Signed)
Pharmacy Patient Advocate Encounter   Received notification from Physician's Office/[facility] that prior authorization for REPATHA is required/requested.   Insurance verification completed.   The patient is insured through [employer] .   Per test claim: REPATHA IS APPROVED   (The approval dates have not been provided as of yet)

## 2023-03-25 NOTE — Telephone Encounter (Signed)
Called patient and advised information.   He asked it to be sent [facility] in [place] Please send thank you

## 2023-03-28 ENCOUNTER — Encounter: Payer: Self-pay | Admitting: Internal Medicine

## 2023-03-28 ENCOUNTER — Encounter (HOSPITAL_COMMUNITY)
Admission: RE | Admit: 2023-03-28 | Discharge: 2023-03-28 | Disposition: A | Discharge status: Home / Self Care | Payer: BC Managed Care – PPO | Source: Ambulatory Visit | Attending: Cardiology | Admitting: Cardiology

## 2023-03-28 DIAGNOSIS — Z955 Presence of coronary angioplasty implant and graft: Secondary | ICD-10-CM | POA: Diagnosis not present

## 2023-03-28 DIAGNOSIS — I2111 ST elevation (STEMI) myocardial infarction involving right coronary artery: Secondary | ICD-10-CM | POA: Diagnosis not present

## 2023-03-28 MED ORDER — REPATHA SURECLICK 140 MG/ML ~~LOC~~ SOAJ: 1.0000 mL | SUBCUTANEOUS | 11 refills | Status: DC

## 2023-03-28 NOTE — Progress Notes (Signed)
Daily Session Note  Patient Details  Name: Daniel Bautista MRN: [id number] Date of Birth: Dec 01, 1960 Referring Provider:   Flowsheet Row CARDIAC REHAB PHASE II ORIENTATION from 03/15/2023 in [facility] CARDIAC REHABILITATION  Referring Provider [name] MD       Encounter Date: 03/28/2023  Check In:  Session Check In - 03/28/23 0900       Check-In   Supervising physician immediately available to respond to emergencies See telemetry face sheet for immediately available ER MD    Location [facility]    Staff Present Staci Righter, RN, Neal Dy, RN, BSN;Heather Fredric Mare, BS, Exercise Physiologist    Virtual Visit No    Medication changes reported     No    Fall or balance concerns reported    No    Warm-up and Cool-down Performed on first and last piece of equipment    Resistance Training Performed Yes    VAD Patient? No    PAD/SET Patient? No      Pain Assessment   Currently in Pain? No/denies    Multiple Pain Sites No             Capillary Blood Glucose: No results found for this or any previous visit (from the past 24 hour(s)).    Social History   Tobacco Use  Smoking Status Former   Current packs/day: 0.00   Types: Cigarettes   Start date: 11/01/1977   Quit date: 11/02/2022   Years since quitting: 0.4   Passive exposure: Never  Smokeless Tobacco Never    Goals Met:  Independence with exercise equipment Exercise tolerated well No report of concerns or symptoms today Strength training completed today  Goals Unmet:  Not Applicable  Comments: Pt able to follow exercise prescription today without complaint.  Will continue to monitor for progression.    Dr. [name] is Medical Director for [facility] Cardiac Rehab

## 2023-03-28 NOTE — Telephone Encounter (Signed)
Advised patient RX sent.

## 2023-03-28 NOTE — Telephone Encounter (Signed)
 Rx for Repatha sent to pharmacy

## 2023-03-28 NOTE — Addendum Note (Signed)
Addended by: Malena Peer D on: 03/28/2023 12:14 PM   Modules accepted: Orders

## 2023-03-29 MED ORDER — MOUNJARO 2.5 MG/0.5ML ~~LOC~~ SOAJ: 2.5000 mg | SUBCUTANEOUS | 0 refills | Status: DC

## 2023-03-29 NOTE — Addendum Note (Signed)
Addended by: Tylene Fantasia on: 03/29/2023 01:27 PM   Modules accepted: Orders

## 2023-03-29 NOTE — Telephone Encounter (Signed)
Patient informed, will be reducing basal insulin dose from 12 units HS to 10 units HS when start Mounjaro 2.5 mg once week.   Will follow up in 4 weeks to titrate dose to 5 mg once week

## 2023-03-30 ENCOUNTER — Encounter (HOSPITAL_COMMUNITY): Payer: BC Managed Care – PPO

## 2023-03-30 ENCOUNTER — Encounter (HOSPITAL_COMMUNITY): Payer: Self-pay | Admitting: *Deleted

## 2023-03-30 DIAGNOSIS — Z955 Presence of coronary angioplasty implant and graft: Secondary | ICD-10-CM

## 2023-03-30 DIAGNOSIS — I2111 ST elevation (STEMI) myocardial infarction involving right coronary artery: Secondary | ICD-10-CM

## 2023-03-30 NOTE — Progress Notes (Signed)
Cardiac Individual Treatment Plan  Patient Details  Name: Daniel Bautista MRN: [id number] Date of Birth: [birth year] Referring Provider:   Flowsheet Row CARDIAC REHAB PHASE II ORIENTATION from 03/15/2023 in [facility] CARDIAC REHABILITATION  Referring Provider [name] MD       Initial Encounter Date:  Flowsheet Row CARDIAC REHAB PHASE II ORIENTATION from 03/15/2023 in [place] CARDIAC REHABILITATION  Date 03/15/23       Visit Diagnosis: ST elevation myocardial infarction involving right coronary artery [facility])  Status post coronary artery stent placement  Patient's Home Medications on Admission:  Current Outpatient Medications:    Accu-Chek Softclix Lancets lancets, Use as directed, Disp: 100 each, Rfl: 0   aspirin EC 81 MG tablet, Take 1 tablet (81 mg total) by mouth daily. Swallow whole., Disp: 90 tablet, Rfl: 3   atorvastatin (LIPITOR) 80 MG tablet, Take 1 tablet (80 mg total) by mouth daily., Disp: 90 tablet, Rfl: 3   Blood Glucose Monitoring Suppl (ACCU-CHEK GUIDE) w/Device KIT, Use up to four times daily as directed, Disp: 1 kit, Rfl: 0   dapagliflozin propanediol (FARXIGA) 10 MG TABS tablet, Take 1 tablet (10 mg total) by mouth daily before breakfast., Disp: 30 tablet, Rfl: 11   Evolocumab (REPATHA SURECLICK) 140 MG/ML SOAJ, Inject 140 mg into the skin every 14 (fourteen) days., Disp: 2 mL, Rfl: 11   glucose blood test strip, Use as directed, Disp: 100 each, Rfl: 0   insulin glargine-yfgn (SEMGLEE) 100 UNIT/ML Pen, Inject 12 Units into the skin daily., Disp: 9 mL, Rfl: 3   Insulin Pen Needle 32G X 4 MM MISC, Use daily, Disp: 100 each, Rfl: 3   metFORMIN (GLUCOPHAGE) 500 MG tablet, Take 1 tablet (500 mg total) by mouth daily with breakfast., Disp: 30 tablet, Rfl: 11   metoprolol succinate (TOPROL XL) 50 MG 24 hr tablet, Take 1 tablet (50 mg total) by mouth daily. Take with or immediately following a meal., Disp: 90 tablet, Rfl: 3   nitroGLYCERIN (NITROSTAT) 0.4 MG SL  tablet, Place 1 tablet (0.4 mg total) under the tongue every 5 (five) minutes x 3 doses as needed for chest pain., Disp: 25 tablet, Rfl: 0   sacubitril-valsartan (ENTRESTO) 49-51 MG, Take 1 tablet by mouth 2 (two) times daily., Disp: 180 tablet, Rfl: 3   spironolactone (ALDACTONE) 25 MG tablet, Take 1 tablet (25 mg total) by mouth daily., Disp: 90 tablet, Rfl: 3   ticagrelor (BRILINTA) 90 MG TABS tablet, Take 1 tablet (90 mg total) by mouth 2 (two) times daily., Disp: 180 tablet, Rfl: 1   tirzepatide (MOUNJARO) 2.5 MG/0.5ML Pen, Inject 2.5 mg into the skin once a week., Disp: 2 mL, Rfl: 0  Past Medical History: Past Medical History:  Diagnosis Date   Acute ST elevation myocardial infarction (STEMI) of inferior wall (HCC) 11/02/2022   Coronary artery disease involving native coronary artery of native heart with unstable angina pectoris (HCC) 11/03/2022   04/04/2023     Culprit lesion: Heavily thrombotic ulcerated /calcified prox-mid RCA 100% occlusion around crux after an RVM branch.  (After angioplasty and stenting there is a long area of the distal RCA into the PDA which is about 65% stenosed => improved some w/ NTG IC, so plan MedRx; OM1 90%, D1 80% (Med Rx).   Current smoker 11/03/2022   Hyperlipidemia associated with type 2 diabetes mellitus (HCC) 11/03/2022   Ischemic cardiomyopathy 11/03/2022   TTE 11/03/2022-s/p inferior STEMI: EF 35 to 40%.  Moderate reduced function.  Global HK.  GR 2 DD.  D-shaped IVS consistent with RV volume overload.  RV function mildly reduced, mildly enlarged.   Presence of drug coated stent in right coronary artery 11/03/2022   Overlapping Synergr DES 2.5 x 48 & 2.75 x 12 - tapered post-dilation 3.1-2.8-2.6 mm (prox-~distal RCA) in setting of Inf STEMI -100% prox RCA   Type 2 diabetes mellitus with complication, without long-term current use of insulin (HCC) 11/03/2022    Tobacco Use: Social History   Tobacco Use  Smoking Status Former   Current packs/day:  0.00   Types: Cigarettes   Start date: 11/01/1977   Quit date: 11/02/2022   Years since quitting: 0.4   Passive exposure: Never  Smokeless Tobacco Never    Labs: Review Flowsheet       Latest Ref Rng & Units 11/02/2022 11/03/2022 01/04/2023  Labs for ITP Cardiac and Pulmonary Rehab  Cholestrol 0 - 200 mg/dL 952  841  324   LDL (calc) 0 - 99 mg/dL 401  027  73   HDL-C >25 mg/dL 41  39  33   Trlycerides <150 mg/dL 366  440  347   Hemoglobin A1c 4.8 - 5.6 % 12.2  11.7  -    Details            Capillary Blood Glucose: Lab Results  Component Value Date   GLUCAP 134 (H) 03/23/2023   GLUCAP 187 (H) 03/23/2023   GLUCAP 221 (H) 03/21/2023   GLUCAP 245 (H) 03/21/2023   GLUCAP 392 (H) 11/05/2022     Exercise Target Goals: Exercise Program Goal: Individual exercise prescription set using results from initial 6 min walk test and THRR while considering  patient's activity barriers and safety.   Exercise Prescription Goal: Starting with aerobic activity 30 plus minutes a day, 3 days per week for initial exercise prescription. Provide home exercise prescription and guidelines that participant acknowledges understanding prior to discharge.  Activity Barriers & Risk Stratification:  Activity Barriers & Cardiac Risk Stratification - 03/14/23 1103       Activity Barriers & Cardiac Risk Stratification   Activity Barriers Deconditioning;Muscular Weakness;Back Problems   occassional back pain   Cardiac Risk Stratification Moderate             6 Minute Walk:  6 Minute Walk     Row Name 03/15/23 0957         6 Minute Walk   Phase Initial     Distance 1200 feet     Walk Time 6 minutes     # of Rest Breaks 0     MPH 2.27     METS 2.68     RPE 9     VO2 Peak 9.37     Symptoms No     Resting HR 57 bpm     Resting BP 104/58     Resting Oxygen Saturation  97 %     Exercise Oxygen Saturation  during 6 min walk 96 %     Max Ex. HR 113 bpm     Max Ex. BP 128/62     2  Minute Post BP 106/64              Oxygen Initial Assessment:   Oxygen Re-Evaluation:   Oxygen Discharge (Final Oxygen Re-Evaluation):   Initial Exercise Prescription:  Initial Exercise Prescription - 03/15/23 1000       Date of Initial Exercise RX and Referring Provider   Date 03/15/23    Referring Provider  [name] MD      Oxygen   Maintain Oxygen Saturation 88% or higher      Treadmill   MPH 2.2    Grade 0.5    Minutes 15    METs 2.84      REL-XR   Level 2    Speed 50    Minutes 15    METs 2.5      Prescription Details   Frequency (times per week) 3    Duration Progress to 30 minutes of continuous aerobic without signs/symptoms of physical distress      Intensity   THRR 40-80% of Max Heartrate 97-138    Ratings of Perceived Exertion 11-13    Perceived Dyspnea 0-4      Progression   Progression Continue to progress workloads to maintain intensity without signs/symptoms of physical distress.      Resistance Training   Training Prescription Yes    Weight 4 lb    Reps 10-15             Perform Capillary Blood Glucose checks as needed.  Exercise Prescription Changes:   Exercise Prescription Changes     Row Name 03/15/23 1000             Response to Exercise   Blood Pressure (Admit) 104/58       Blood Pressure (Exercise) 128/62       Blood Pressure (Exit) 106/64       Heart Rate (Admit) 57 bpm       Heart Rate (Exercise) 113 bpm       Heart Rate (Exit) 60 bpm       Oxygen Saturation (Admit) 97 %       Oxygen Saturation (Exercise) 96 %       Rating of Perceived Exertion (Exercise) 9       Symptoms none       Comments walk test results                Exercise Comments:   Exercise Comments     Row Name 03/21/23 1116           Exercise Comments First full day of exercise!  Patient was oriented to gym and equipment including functions, settings, policies, and procedures.  Patient's individual exercise prescription  and treatment plan were reviewed.  All starting workloads were established based on the results of the 6 minute walk test done at initial orientation visit.  The plan for exercise progression was also introduced and progression will be customized based on patient's performance and goals.                Exercise Goals and Review:   Exercise Goals     Row Name 03/15/23 1004             Exercise Goals   Increase Physical Activity Yes       Intervention Provide advice, education, support and counseling about physical activity/exercise needs.;Develop an individualized exercise prescription for aerobic and resistive training based on initial evaluation findings, risk stratification, comorbidities and participant's personal goals.       Expected Outcomes Short Term: Attend rehab on a regular basis to increase amount of physical activity.;Long Term: Add in home exercise to make exercise part of routine and to increase amount of physical activity.;Long Term: Exercising regularly at least 3-5 days a week.       Increase Strength and Stamina Yes       Intervention Provide  advice, education, support and counseling about physical activity/exercise needs.;Develop an individualized exercise prescription for aerobic and resistive training based on initial evaluation findings, risk stratification, comorbidities and participant's personal goals.       Expected Outcomes Short Term: Increase workloads from initial exercise prescription for resistance, speed, and METs.;Short Term: Perform resistance training exercises routinely during rehab and add in resistance training at home;Long Term: Improve cardiorespiratory fitness, muscular endurance and strength as measured by increased METs and functional capacity ( )       Able to understand and use rate of perceived exertion (RPE) scale Yes       Intervention Provide education and explanation on how to use RPE scale       Expected Outcomes Short Term: Able to  use RPE daily in rehab to express subjective intensity level;Long Term:  Able to use RPE to guide intensity level when exercising independently       Able to understand and use Dyspnea scale Yes       Intervention Provide education and explanation on how to use Dyspnea scale       Expected Outcomes Short Term: Able to use Dyspnea scale daily in rehab to express subjective sense of shortness of breath during exertion;Long Term: Able to use Dyspnea scale to guide intensity level when exercising independently       Knowledge and understanding of Target Heart Rate Range (THRR) Yes       Intervention Provide education and explanation of THRR including how the numbers were predicted and where they are located for reference       Expected Outcomes Long Term: Able to use THRR to govern intensity when exercising independently;Short Term: Able to state/look up THRR;Short Term: Able to use daily as guideline for intensity in rehab       Able to check pulse independently Yes       Intervention Provide education and demonstration on how to check pulse in carotid and radial arteries.;Review the importance of being able to check your own pulse for safety during independent exercise       Expected Outcomes Short Term: Able to explain why pulse checking is important during independent exercise;Long Term: Able to check pulse independently and accurately       Understanding of Exercise Prescription Yes       Intervention Provide education, explanation, and written materials on patient's individual exercise prescription       Expected Outcomes Short Term: Able to explain program exercise prescription;Long Term: Able to explain home exercise prescription to exercise independently                Exercise Goals Re-Evaluation :  Exercise Goals Re-Evaluation     Row Name 03/15/23 1005             Exercise Goal Re-Evaluation   Exercise Goals Review Able to understand and use rate of perceived exertion (RPE)  scale;Able to understand and use Dyspnea scale;Understanding of Exercise Prescription       Comments Reviewed RPE  and dyspnea scale, and program prescription with pt today.  Pt voiced understanding and was given a copy of goals to take home.       Expected Outcomes Short: Use RPE daily to regulate intensity.  Long: Follow program prescription                 Discharge Exercise Prescription (Final Exercise Prescription Changes):  Exercise Prescription Changes - 03/15/23 1000       Response to  Exercise   Blood Pressure (Admit) 104/58    Blood Pressure (Exercise) 128/62    Blood Pressure (Exit) 106/64    Heart Rate (Admit) 57 bpm    Heart Rate (Exercise) 113 bpm    Heart Rate (Exit) 60 bpm    Oxygen Saturation (Admit) 97 %    Oxygen Saturation (Exercise) 96 %    Rating of Perceived Exertion (Exercise) 9    Symptoms none    Comments walk test results             Nutrition:  Target Goals: Understanding of nutrition guidelines, daily intake of sodium 1500mg , cholesterol 200mg , calories 30% from fat and 7% or less from saturated fats, daily to have 5 or more servings of fruits and vegetables.  Biometrics:  Pre Biometrics - 03/15/23 1005       Pre Biometrics   Height 5' 8.25" (1.734 m)    Weight 176 lb 14.4 oz (80.2 kg)    Waist Circumference 36 inches    Hip Circumference 37.5 inches    Waist to Hip Ratio 0.96 %    BMI (Calculated) 26.69    Grip Strength 30.7 kg    Single Leg Stand 10.2 seconds              Nutrition Therapy Plan and Nutrition Goals:  Nutrition Therapy & Goals - 03/14/23 1119       Intervention Plan   Intervention Prescribe, educate and counsel regarding individualized specific dietary modifications aiming towards targeted core components such as weight, hypertension, lipid management, diabetes, heart failure and other comorbidities.;Nutrition handout(s) given to patient.    Expected Outcomes Short Term Goal: Understand basic principles of  dietary content, such as calories, fat, sodium, cholesterol and nutrients.             Nutrition Assessments:  MEDIFICTS Score Key: ?70 Need to make dietary changes  40-70 Heart Healthy Diet ? 40 Therapeutic Level Cholesterol Diet  Flowsheet Row CARDIAC REHAB PHASE II ORIENTATION from 03/15/2023 in [facility] CARDIAC REHABILITATION  Picture Your Plate Total Score on Admission 48      Picture Your Plate Scores: <13 Unhealthy dietary pattern with much room for improvement. 41-50 Dietary pattern unlikely to meet recommendations for good health and room for improvement. 51-60 More healthful dietary pattern, with some room for improvement.  >60 Healthy dietary pattern, although there may be some specific behaviors that could be improved.    Nutrition Goals Re-Evaluation:   Nutrition Goals Discharge (Final Nutrition Goals Re-Evaluation):   Psychosocial: Target Goals: Acknowledge presence or absence of significant depression and/or stress, maximize coping skills, provide positive support system. Participant is able to verbalize types and ability to use techniques and skills needed for reducing stress and depression.  Initial Review & Psychosocial Screening:  Initial Psych Review & Screening - 03/14/23 1105       Initial Review   Current issues with Current Stress Concerns    Source of Stress Concerns Financial;Unable to participate in former interests or hobbies;Occupation;Retirement/disability    Comments Just retired from work on 7/25, quit smoking in March, takes his time, gets about 6 hr a night for sleep      Family Dynamics   Good Support System? Yes   wife     Barriers   Psychosocial barriers to participate in program The patient should benefit from training in stress management and relaxation.;Psychosocial barriers identified (see note)      Screening Interventions   Interventions Encouraged to exercise;Provide feedback  about the scores to participant;To provide  support and resources with identified psychosocial needs    Expected Outcomes Short Term goal: Utilizing psychosocial counselor, staff and physician to assist with identification of specific Stressors or current issues interfering with healing process. Setting desired goal for each stressor or current issue identified.;Long Term Goal: Stressors or current issues are controlled or eliminated.;Short Term goal: Identification and review with participant of any Quality of Life or Depression concerns found by scoring the questionnaire.;Long Term goal: The participant improves quality of Life and PHQ9 Scores as seen by post scores and/or verbalization of changes             Quality of Life Scores:  Quality of Life - 03/15/23 1006       Quality of Life   Select Quality of Life      Quality of Life Scores   Health/Function Pre 24.8 %    Socioeconomic Pre 27.43 %    Psych/Spiritual Pre 30 %    Family Pre 30 %    GLOBAL Pre 27.18 %            Scores of 19 and below usually indicate a poorer quality of life in these areas.  A difference of  2-3 points is a clinically meaningful difference.  A difference of 2-3 points in the total score of the Quality of Life Index has been associated with significant improvement in overall quality of life, self-image, physical symptoms, and general health in studies assessing change in quality of life.  PHQ-9: Review Flowsheet       03/15/2023  Depression screen PHQ 2/9  Decreased Interest 0  Down, Depressed, Hopeless 0  PHQ - 2 Score 0  Altered sleeping 0  Tired, decreased energy 1  Change in appetite 2  Feeling bad or failure about yourself  0  Trouble concentrating 0  Moving slowly or fidgety/restless 0  Suicidal thoughts 0  PHQ-9 Score 3  Difficult doing work/chores Not difficult at all    Details           Interpretation of Total Score  Total Score Depression Severity:  1-4 = Minimal depression, 5-9 = Mild depression, 10-14 =  Moderate depression, 15-19 = Moderately severe depression, 20-27 = Severe depression   Psychosocial Evaluation and Intervention:  Psychosocial Evaluation - 03/14/23 1111       Psychosocial Evaluation & Interventions   Interventions Encouraged to exercise with the program and follow exercise prescription;Stress management education    Comments Richter is coming into cardiac rehab 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  He is doing well from a cardiac standpoint.  He has recently quit smoking after his heart attack and has not had any cravings.  He is feeling good about his accomplishment and has been praised by all providers.  He offically retired last week to reduce stress in his life and is feeling pretty good about it.  He tries not to let too much get to him.  It is just him and his wife who is a great supporter for him.  He sleeps well most nights.  He enjoys getting out to fish, but has not been able to do much with the heat this summer. He is not sure what to expect from program but would like to build stamina and feel a bit more confident after his heart event.    Expected Outcomes Short: Attend rehab to boost stamina Long: Build confidence to live life without worry about heart  Continue Psychosocial Services  Follow up required by staff             Psychosocial Re-Evaluation:   Psychosocial Discharge (Final Psychosocial Re-Evaluation):   Vocational Rehabilitation: Provide vocational rehab assistance to qualifying candidates.   Vocational Rehab Evaluation & Intervention:  Vocational Rehab - 03/14/23 1104       Initial Vocational Rehab Evaluation & Intervention   Assessment shows need for Vocational Rehabilitation No   just retired Thursday  03/10/23            Education: Education Goals: Education classes will be provided on a weekly basis, covering required topics. Participant will state understanding/return demonstration of topics presented.  Learning  Barriers/Preferences:  Learning Barriers/Preferences - 03/14/23 1102       Learning Barriers/Preferences   Learning Barriers Sight   glasses   Learning Preferences None             Education Topics: Hypertension, Hypertension Reduction -Define heart disease and high blood pressure. Discus how high blood pressure affects the body and ways to reduce high blood pressure.   Exercise and Your Heart -Discuss why it is important to exercise, the FITT principles of exercise, normal and abnormal responses to exercise, and how to exercise safely.   Angina -Discuss definition of angina, causes of angina, treatment of angina, and how to decrease risk of having angina.   Cardiac Medications -Review what the following cardiac medications are used for, how they affect the body, and side effects that may occur when taking the medications.  Medications include Aspirin, Beta blockers, calcium channel blockers, ACE Inhibitors, angiotensin receptor blockers, diuretics, digoxin, and antihyperlipidemics.   Congestive Heart Failure -Discuss the definition of CHF, how to live with CHF, the signs and symptoms of CHF, and how keep track of weight and sodium intake.   Heart Disease and Intimacy -Discus the effect sexual activity has on the heart, how changes occur during intimacy as we age, and safety during sexual activity. Flowsheet Row CARDIAC REHAB PHASE II EXERCISE from 03/23/2023 in [place] CARDIAC REHABILITATION  Date 03/23/23  Educator [facility]  Instruction Review Code 1- Verbalizes Understanding       Smoking Cessation / COPD -Discuss different methods to quit smoking, the health benefits of quitting smoking, and the definition of COPD.   Nutrition I: Fats -Discuss the types of cholesterol, what cholesterol does to the heart, and how cholesterol levels can be controlled.   Nutrition II: Labels -Discuss the different components of food labels and how to read food label   Heart  Parts/Heart Disease and PAD -Discuss the anatomy of the heart, the pathway of blood circulation through the heart, and these are affected by heart disease.   Stress I: Signs and Symptoms -Discuss the causes of stress, how stress may lead to anxiety and depression, and ways to limit stress.   Stress II: Relaxation -Discuss different types of relaxation techniques to limit stress.   Warning Signs of Stroke / TIA -Discuss definition of a stroke, what the signs and symptoms are of a stroke, and how to identify when someone is having stroke.   Knowledge Questionnaire Score:  Knowledge Questionnaire Score - 03/15/23 1007       Knowledge Questionnaire Score   Pre Score 20/28             Core Components/Risk Factors/Patient Goals at Admission:  Personal Goals and Risk Factors at Admission - 03/15/23 1007       Core Components/Risk Factors/Patient Goals on  Admission    Weight Management Yes;Weight Loss    Intervention Weight Management: Develop a combined nutrition and exercise program designed to reach desired caloric intake, while maintaining appropriate intake of nutrient and fiber, sodium and fats, and appropriate energy expenditure required for the weight goal.;Weight Management: Provide education and appropriate resources to help participant work on and attain dietary goals.;Weight Management/Obesity: Establish reasonable short term and long term weight goals.    Admit Weight 176 lb 14.4 oz (80.2 kg)    Goal Weight: Short Term 171 lb (77.6 kg)    Goal Weight: Long Term 165 lb (74.8 kg)    Expected Outcomes Short Term: Continue to assess and modify interventions until short term weight is achieved;Long Term: Adherence to nutrition and physical activity/exercise program aimed toward attainment of established weight goal;Weight Loss: Understanding of general recommendations for a balanced deficit meal plan, which promotes 1-2 lb weight loss per week and includes a negative energy  balance of [phone] kcal/d;Understanding recommendations for meals to include 15-35% energy as protein, 25-35% energy from fat, 35-60% energy from carbohydrates, less than 200mg  of dietary cholesterol, 20-35 gm of total fiber daily;Understanding of distribution of calorie intake throughout the day with the consumption of 4-5 meals/snacks    Tobacco Cessation Yes   quit 11/02/22   Intervention Assist the participant in steps to quit. Provide individualized education and counseling about committing to Tobacco Cessation, relapse prevention, and pharmacological support that can be provided by physician.    Expected Outcomes Long Term: Complete abstinence from all tobacco products for at least 12 months from quit date.;Short Term: Will quit all tobacco product use, adhering to prevention of relapse plan.    Diabetes Yes    Intervention Provide education about signs/symptoms and action to take for hypo/hyperglycemia.;Provide education about proper nutrition, including hydration, and aerobic/resistive exercise prescription along with prescribed medications to achieve blood glucose in normal ranges: Fasting glucose 65-99 mg/dL    Expected Outcomes Short Term: Participant verbalizes understanding of the signs/symptoms and immediate care of hyper/hypoglycemia, proper foot care and importance of medication, aerobic/resistive exercise and nutrition plan for blood glucose control.;Long Term: Attainment of HbA1C < 7%.    Heart Failure Yes    Intervention Provide a combined exercise and nutrition program that is supplemented with education, support and counseling about heart failure. Directed toward relieving symptoms such as shortness of breath, decreased exercise tolerance, and extremity edema.    Expected Outcomes Improve functional capacity of life;Short term: Attendance in program 2-3 days a week with increased exercise capacity. Reported lower sodium intake. Reported increased fruit and vegetable intake. Reports  medication compliance.;Short term: Daily weights obtained and reported for increase. Utilizing diuretic protocols set by physician.;Long term: Adoption of self-care skills and reduction of barriers for early signs and symptoms recognition and intervention leading to self-care maintenance.    Hypertension Yes    Intervention Provide education on lifestyle modifcations including regular physical activity/exercise, weight management, moderate sodium restriction and increased consumption of fresh fruit, vegetables, and low fat dairy, alcohol moderation, and smoking cessation.;Monitor prescription use compliance.    Expected Outcomes Short Term: Continued assessment and intervention until BP is < 140/86mm HG in hypertensive participants. < 130/59mm HG in hypertensive participants with diabetes, heart failure or chronic kidney disease.;Long Term: Maintenance of blood pressure at goal levels.    Lipids Yes    Intervention Provide education and support for participant on nutrition & aerobic/resistive exercise along with prescribed medications to achieve LDL 70mg , HDL >40mg .  Expected Outcomes Short Term: Participant states understanding of desired cholesterol values and is compliant with medications prescribed. Participant is following exercise prescription and nutrition guidelines.;Long Term: Cholesterol controlled with medications as prescribed, with individualized exercise RX and with personalized nutrition plan. Value goals: LDL < 70mg , HDL > 40 mg.             Core Components/Risk Factors/Patient Goals Review:   Goals and Risk Factor Review     Row Name 03/14/23 1121             Core Components/Risk Factors/Patient Goals Review   Personal Goals Review Tobacco Cessation       Review Tomy has recently quit tobacco use within the last 6 months. He quit at time of heart event.  Intervention for relapse prevention was provided at the initial medical review. He was encouraged to continue to with  tobacco cessation and was provided information on relapse prevention. Patient received information about combination therapy, tobacco cessation classes, quit line, and quit smoking apps in case of a relapse. Patient demonstrated understanding of this material.Staff will continue to provide encouragement and follow up with the patient throughout the program.       Expected Outcomes Continued cessation                Core Components/Risk Factors/Patient Goals at Discharge (Final Review):   Goals and Risk Factor Review - 03/14/23 1121       Core Components/Risk Factors/Patient Goals Review   Personal Goals Review Tobacco Cessation    Review Abdullah has recently quit tobacco use within the last 6 months. He quit at time of heart event.  Intervention for relapse prevention was provided at the initial medical review. He was encouraged to continue to with tobacco cessation and was provided information on relapse prevention. Patient received information about combination therapy, tobacco cessation classes, quit line, and quit smoking apps in case of a relapse. Patient demonstrated understanding of this material.Staff will continue to provide encouragement and follow up with the patient throughout the program.    Expected Outcomes Continued cessation             ITP Comments:  ITP Comments     Row Name 03/14/23 1120 03/15/23 0956 03/21/23 1116 03/30/23 0809     ITP Comments Completed virtual orientation today.  EP evaluation is scheduled for 03/15/23 at 830.  Documentation for diagnosis can be found in [facility] encounter 11/02/22. Patient attend orientation today.  Patient is attendingCardiac Rehabilitation Program.  Documentation for diagnosis can be found in [facility] encounter 11/02/22.  Reviewed medical chart, RPE/RPD, gym safety, and program guidelines.  Patient was fitted to equipment they will be using during rehab.  Patient is scheduled to start exercise on 03/21/23 at 930.   Initial ITP created and sent  for review and signature by Dr. [name], Medical Director for Cardiac Rehabilitation Program. First full day of exercise!  Patient was oriented to gym and equipment including functions, settings, policies, and procedures.  Patient's individual exercise prescription and treatment plan were reviewed.  All starting workloads were established based on the results of the 6 minute walk test done at initial orientation visit.  The plan for exercise progression was also introduced and progression will be customized based on patient's performance and goals. 30 day review completed. ITP sent to Dr. [name], Medical Director of Cardiac Rehab. Continue with ITP unless changes are made by physician.  Comments: 30 day review

## 2023-04-01 ENCOUNTER — Encounter (HOSPITAL_COMMUNITY)
Admission: RE | Admit: 2023-04-01 | Discharge: 2023-04-01 | Disposition: A | Discharge status: Home / Self Care | Payer: BC Managed Care – PPO | Source: Ambulatory Visit | Attending: Cardiology | Admitting: Cardiology

## 2023-04-01 ENCOUNTER — Encounter (HOSPITAL_COMMUNITY): Payer: BC Managed Care – PPO

## 2023-04-01 DIAGNOSIS — I2111 ST elevation (STEMI) myocardial infarction involving right coronary artery: Secondary | ICD-10-CM

## 2023-04-01 DIAGNOSIS — Z955 Presence of coronary angioplasty implant and graft: Secondary | ICD-10-CM

## 2023-04-01 LAB — GLUCOSE, CAPILLARY
Glucose-Capillary: 216 mg/dL — ABNORMAL HIGH (ref 70–99)
Glucose-Capillary: 163 mg/dL — ABNORMAL HIGH (ref 70–99)

## 2023-04-01 NOTE — Progress Notes (Signed)
Daily Session Note  Patient Details  Name: Daniel Bautista MRN: [id number] Date of Birth: 11/17/1960 Referring Provider:   Flowsheet Row CARDIAC REHAB PHASE II ORIENTATION from 03/15/2023 in [facility] CARDIAC REHABILITATION  Referring Provider [name] MD       Encounter Date: 04/01/2023  Check In:  Session Check In - 04/01/23 0905       Check-In   Supervising physician immediately available to respond to emergencies See telemetry face sheet for immediately available MD    Location [facility]    Staff Present Ross Ludwig, BS, Exercise Physiologist;Debra Laural Benes, RN, Pleas Koch, RN, BSN    Virtual Visit No    Medication changes reported     No    Fall or balance concerns reported    No    Tobacco Cessation No Change    Warm-up and Cool-down Performed on first and last piece of equipment    Resistance Training Performed Yes    VAD Patient? No      Pain Assessment   Currently in Pain? No/denies             Capillary Blood Glucose: Results for orders placed or performed during the hospital encounter of 04/01/23 (from the past 24 hour(s))  Glucose, capillary     Status: Abnormal   Collection Time: 04/01/23  9:09 AM  Result Value Ref Range   Glucose-Capillary 216 (H) 70 - 99 mg/dL      Social History   Tobacco Use  Smoking Status Former   Current packs/day: 0.00   Types: Cigarettes   Start date: 11/01/1977   Quit date: 11/02/2022   Years since quitting: 0.4   Passive exposure: Never  Smokeless Tobacco Never    Goals Met:  Independence with exercise equipment Exercise tolerated well No report of concerns or symptoms today Strength training completed today  Goals Unmet:  Not Applicable  Comments: Pt able to follow exercise prescription today without complaint.  Will continue to monitor for progression.    Dr. [name] is Medical Director for [facility] Cardiac Rehab

## 2023-04-04 ENCOUNTER — Encounter (HOSPITAL_COMMUNITY): Payer: BC Managed Care – PPO

## 2023-04-04 ENCOUNTER — Encounter (HOSPITAL_COMMUNITY)
Admission: RE | Admit: 2023-04-04 | Discharge: 2023-04-04 | Disposition: A | Discharge status: Home / Self Care | Payer: BC Managed Care – PPO | Source: Ambulatory Visit | Attending: Cardiology | Admitting: Cardiology

## 2023-04-04 DIAGNOSIS — I2111 ST elevation (STEMI) myocardial infarction involving right coronary artery: Secondary | ICD-10-CM | POA: Diagnosis not present

## 2023-04-04 DIAGNOSIS — Z955 Presence of coronary angioplasty implant and graft: Secondary | ICD-10-CM | POA: Diagnosis not present

## 2023-04-04 NOTE — Progress Notes (Signed)
Daily Session Note  Patient Details  Name: NOELLE DOTO MRN: [id number] Date of Birth: [birth year] Referring Provider:   Flowsheet Row CARDIAC REHAB PHASE II ORIENTATION from 03/15/2023 in [facility] CARDIAC REHABILITATION  Referring Provider [name] MD       Encounter Date: 04/04/2023  Check In:  Session Check In - 04/04/23 0900       Check-In   Supervising physician immediately available to respond to emergencies See telemetry face sheet for immediately available MD    Location [facility]    Staff Present Fabio Pierce, MA, RCEP, CCRP, Dow Adolph, RN, Pleas Koch, RN, BSN    Virtual Visit No    Medication changes reported     No    Fall or balance concerns reported    No    Tobacco Cessation No Change    Warm-up and Cool-down Performed on first and last piece of equipment    Resistance Training Performed Yes    VAD Patient? No      Pain Assessment   Currently in Pain? No/denies             Capillary Blood Glucose: No results found for this or any previous visit (from the past 24 hour(s)).    Social History   Tobacco Use  Smoking Status Former   Current packs/day: 0.00   Types: Cigarettes   Start date: 11/01/1977   Quit date: 11/02/2022   Years since quitting: 0.4   Passive exposure: Never  Smokeless Tobacco Never    Goals Met:  Independence with exercise equipment Exercise tolerated well No report of concerns or symptoms today Strength training completed today  Goals Unmet:  Not Applicable  Comments: Pt able to follow exercise prescription today without complaint.  Will continue to monitor for progression.    Dr. [name] is Medical Director for [facility] Cardiac Rehab

## 2023-04-05 NOTE — Telephone Encounter (Signed)
PA approved for both.

## 2023-04-06 ENCOUNTER — Encounter (HOSPITAL_COMMUNITY): Payer: BC Managed Care – PPO

## 2023-04-06 ENCOUNTER — Encounter (HOSPITAL_COMMUNITY)
Admission: RE | Admit: 2023-04-06 | Discharge: 2023-04-06 | Disposition: A | Discharge status: Home / Self Care | Payer: BC Managed Care – PPO | Source: Ambulatory Visit | Attending: Cardiology | Admitting: Cardiology

## 2023-04-06 DIAGNOSIS — I2111 ST elevation (STEMI) myocardial infarction involving right coronary artery: Secondary | ICD-10-CM

## 2023-04-06 DIAGNOSIS — Z955 Presence of coronary angioplasty implant and graft: Secondary | ICD-10-CM

## 2023-04-06 NOTE — Progress Notes (Signed)
Daily Session Note  Patient Details  Name: Daniel Bautista MRN: [id number] Date of Birth: 1960/09/02 Referring Provider:   Flowsheet Row CARDIAC REHAB PHASE II ORIENTATION from 03/15/2023 in [facility] CARDIAC REHABILITATION  Referring Provider [name] MD       Encounter Date: 04/06/2023  Check In:  Session Check In - 04/06/23 0915       Check-In   Supervising physician immediately available to respond to emergencies See telemetry face sheet for immediately available ER MD    Location [facility]    Staff Present Rodena Medin, RN, BSN;Jessica Juanetta Gosling, MA, RCEP, CCRP, CCET;Heather Gaynell Face, Exercise Physiologist;Hillary Leonidas Romberg BSN, RN    Virtual Visit No    Medication changes reported     No    Fall or balance concerns reported    No    Warm-up and Cool-down Performed on first and last piece of equipment    Resistance Training Performed Yes    VAD Patient? No    PAD/SET Patient? No      Pain Assessment   Currently in Pain? No/denies    Multiple Pain Sites No             Capillary Blood Glucose: No results found for this or any previous visit (from the past 24 hour(s)).    Social History   Tobacco Use  Smoking Status Former   Current packs/day: 0.00   Types: Cigarettes   Start date: 11/01/1977   Quit date: 11/02/2022   Years since quitting: 0.4   Passive exposure: Never  Smokeless Tobacco Never    Goals Met:  Independence with exercise equipment Exercise tolerated well No report of concerns or symptoms today Strength training completed today  Goals Unmet:  Not Applicable  Comments: Pt able to follow exercise prescription today without complaint.  Will continue to monitor for progression.    Dr. [name] is Medical Director for [facility] Cardiac Rehab

## 2023-04-08 ENCOUNTER — Encounter (HOSPITAL_COMMUNITY)
Admission: RE | Admit: 2023-04-08 | Discharge: 2023-04-08 | Disposition: A | Discharge status: Home / Self Care | Payer: BC Managed Care – PPO | Source: Ambulatory Visit | Attending: Cardiology | Admitting: Cardiology

## 2023-04-08 ENCOUNTER — Encounter (HOSPITAL_COMMUNITY): Payer: BC Managed Care – PPO

## 2023-04-08 DIAGNOSIS — I2111 ST elevation (STEMI) myocardial infarction involving right coronary artery: Secondary | ICD-10-CM | POA: Diagnosis not present

## 2023-04-08 DIAGNOSIS — Z955 Presence of coronary angioplasty implant and graft: Secondary | ICD-10-CM

## 2023-04-08 NOTE — Progress Notes (Signed)
Daily Session Note  Patient Details  Name: Daniel Bautista MRN: [id number] Date of Birth: Sep 17, 1960 Referring Provider:   Flowsheet Row CARDIAC REHAB PHASE II ORIENTATION from 03/15/2023 in [facility] CARDIAC REHABILITATION  Referring Provider [name] MD       Encounter Date: 04/08/2023  Check In:  Session Check In - 04/08/23 0920       Check-In   Supervising physician immediately available to respond to emergencies See telemetry face sheet for immediately available MD    Location [facility]    Staff Present Rodena Medin, RN, BSN;Christyan Reger Juanetta Gosling, MA, RCEP, CCRP, CCET;Heather Fredric Mare, [place], Exercise Physiologist    Virtual Visit No    Medication changes reported     No    Fall or balance concerns reported    No    Warm-up and Cool-down Performed on first and last piece of equipment    Resistance Training Performed Yes    VAD Patient? No    PAD/SET Patient? No      Pain Assessment   Currently in Pain? No/denies             Capillary Blood Glucose: No results found for this or any previous visit (from the past 24 hour(s)).    Social History   Tobacco Use  Smoking Status Former   Current packs/day: 0.00   Types: Cigarettes   Start date: 11/01/1977   Quit date: 11/02/2022   Years since quitting: 0.4   Passive exposure: Never  Smokeless Tobacco Never    Goals Met:  Independence with exercise equipment Exercise tolerated well No report of concerns or symptoms today Strength training completed today  Goals Unmet:  Not Applicable  Comments: Pt able to follow exercise prescription today without complaint.  Will continue to monitor for progression.    Dr. [name] is Medical Director for [facility] Cardiac Rehab

## 2023-04-11 ENCOUNTER — Encounter (HOSPITAL_COMMUNITY)
Admission: RE | Admit: 2023-04-11 | Discharge: 2023-04-11 | Disposition: A | Discharge status: Home / Self Care | Payer: BC Managed Care – PPO | Source: Ambulatory Visit | Attending: Cardiology | Admitting: Cardiology

## 2023-04-11 ENCOUNTER — Encounter (HOSPITAL_COMMUNITY): Payer: BC Managed Care – PPO

## 2023-04-11 DIAGNOSIS — I2111 ST elevation (STEMI) myocardial infarction involving right coronary artery: Secondary | ICD-10-CM

## 2023-04-11 DIAGNOSIS — Z955 Presence of coronary angioplasty implant and graft: Secondary | ICD-10-CM | POA: Diagnosis not present

## 2023-04-11 NOTE — Progress Notes (Signed)
Daily Session Note  Patient Details  Name: Daniel Bautista MRN: [id number] Date of Birth: 1961-05-10 Referring Provider:   Flowsheet Row CARDIAC REHAB PHASE II ORIENTATION from 03/15/2023 in [facility] CARDIAC REHABILITATION  Referring Provider [name] MD       Encounter Date: 04/11/2023  Check In:  Session Check In - 04/11/23 0900       Check-In   Supervising physician immediately available to respond to emergencies See telemetry face sheet for immediately available ER MD    Location [facility]    Staff Present Fabio Pierce, MA, RCEP, CCRP, Dow Adolph, RN, BSN;Heather Fredric Mare, [place], Exercise Physiologist    Virtual Visit No    Medication changes reported     No    Fall or balance concerns reported    No    Warm-up and Cool-down Performed on first and last piece of equipment    Resistance Training Performed Yes    VAD Patient? No    PAD/SET Patient? No      Pain Assessment   Currently in Pain? No/denies    Multiple Pain Sites No             Capillary Blood Glucose: No results found for this or any previous visit (from the past 24 hour(s)).    Social History   Tobacco Use  Smoking Status Former   Current packs/day: 0.00   Types: Cigarettes   Start date: 11/01/1977   Quit date: 11/02/2022   Years since quitting: 0.4   Passive exposure: Never  Smokeless Tobacco Never    Goals Met:  Independence with exercise equipment Exercise tolerated well No report of concerns or symptoms today Strength training completed today  Goals Unmet:  Not Applicable  Comments: Pt able to follow exercise prescription today without complaint.  Will continue to monitor for progression.    Dr. [name] is Medical Director for [facility] Cardiac Rehab

## 2023-04-13 ENCOUNTER — Telehealth: Payer: Self-pay | Admitting: Pharmacist

## 2023-04-13 ENCOUNTER — Encounter (HOSPITAL_COMMUNITY)
Admission: RE | Admit: 2023-04-13 | Discharge: 2023-04-13 | Disposition: A | Discharge status: Home / Self Care | Payer: BC Managed Care – PPO | Source: Ambulatory Visit | Attending: Cardiology | Admitting: Cardiology

## 2023-04-13 ENCOUNTER — Encounter (HOSPITAL_COMMUNITY): Payer: BC Managed Care – PPO

## 2023-04-13 DIAGNOSIS — Z955 Presence of coronary angioplasty implant and graft: Secondary | ICD-10-CM

## 2023-04-13 DIAGNOSIS — I2111 ST elevation (STEMI) myocardial infarction involving right coronary artery: Secondary | ICD-10-CM

## 2023-04-13 MED ORDER — INSULIN GLARGINE-YFGN 100 UNIT/ML ~~LOC~~ SOPN: 12.0000 [IU] | PEN_INJECTOR | Freq: Every day | SUBCUTANEOUS | 0 refills | Status: DC

## 2023-04-13 MED ORDER — MOUNJARO 5 MG/0.5ML ~~LOC~~ SOAJ: 5.0000 mg | SUBCUTANEOUS | 0 refills | Status: DC

## 2023-04-13 NOTE — Telephone Encounter (Signed)
We received refill request for [facility] from pharmacy. Called patient , reports his FBG ~95-120. He is tolerating Mounjaro 2.5 mg dose well. It has affected his BM so suggested to take Miralax with plenty of water everyday. He is still on 12 units of basal insulin suggest to lower the doe to 10 units when start the 5 mg Mounjaro to avoid hypoglycemia Will send prescription foe Semglee and Mounjaro 5 mg.

## 2023-04-13 NOTE — Progress Notes (Signed)
Daily Session Note  Patient Details  Name: Daniel Bautista MRN: [id number] Date of Birth: 04-20-1961 Referring Provider:   Flowsheet Row CARDIAC REHAB PHASE II ORIENTATION from 03/15/2023 in [facility] CARDIAC REHABILITATION  Referring Provider [name] MD       Encounter Date: 04/13/2023  Check In:  Session Check In - 04/13/23 0933       Check-In   Supervising physician immediately available to respond to emergencies See telemetry face sheet for immediately available MD    Location [facility]    Staff Present Ross Ludwig, BS, Exercise Physiologist;Mal Asher Juanetta Gosling, MA, RCEP, CCRP, Dow Adolph, RN, Engineer, mining, RN    Virtual Visit No    Medication changes reported     No    Fall or balance concerns reported    No    Warm-up and Cool-down Performed on first and last piece of Public house manager Performed Yes    VAD Patient? No    PAD/SET Patient? No      Pain Assessment   Currently in Pain? No/denies             Capillary Blood Glucose: No results found for this or any previous visit (from the past 24 hour(s)).    Social History   Tobacco Use  Smoking Status Former   Current packs/day: 0.00   Types: Cigarettes   Start date: 11/01/1977   Quit date: 11/02/2022   Years since quitting: 0.4   Passive exposure: Never  Smokeless Tobacco Never    Goals Met:  Independence with exercise equipment Exercise tolerated well Personal goals reviewed No report of concerns or symptoms today Strength training completed today  Goals Unmet:  Not Applicable  Comments: Pt able to follow exercise prescription today without complaint.  Will continue to monitor for progression.    Dr. [name] is Medical Director for [facility] Cardiac Rehab

## 2023-04-15 ENCOUNTER — Encounter (HOSPITAL_COMMUNITY)
Admission: RE | Admit: 2023-04-15 | Discharge: 2023-04-15 | Disposition: A | Discharge status: Home / Self Care | Payer: BC Managed Care – PPO | Source: Ambulatory Visit | Attending: Cardiology | Admitting: Cardiology

## 2023-04-15 ENCOUNTER — Encounter (HOSPITAL_COMMUNITY): Payer: BC Managed Care – PPO

## 2023-04-15 DIAGNOSIS — Z955 Presence of coronary angioplasty implant and graft: Secondary | ICD-10-CM

## 2023-04-15 DIAGNOSIS — I2111 ST elevation (STEMI) myocardial infarction involving right coronary artery: Secondary | ICD-10-CM

## 2023-04-15 NOTE — Progress Notes (Addendum)
Daily Session Note  Patient Details  Name: Daniel Bautista MRN: [id number] Date of Birth: Jul 29, 1961 Referring Provider:   Flowsheet Row CARDIAC REHAB PHASE II ORIENTATION from 03/15/2023 in [facility] CARDIAC REHABILITATION  Referring Provider [name] MD       Encounter Date: 04/15/2023  Check In:  Session Check In - 04/15/23 4696       Check-In   Supervising physician immediately available to respond to emergencies See telemetry face sheet for immediately available MD    Location [facility]    Staff Present Ross Ludwig, BS, Exercise Physiologist;Adaline Trejos Juanetta Gosling, MA, RCEP, CCRP, CCET;Other;Daphyne Daphine Deutscher, RN, BSN   Kary Kos, [place], CEP   Virtual Visit No    Medication changes reported     No    Fall or balance concerns reported    No    Warm-up and Cool-down Performed on first and last piece of equipment    Resistance Training Performed Yes    VAD Patient? No    PAD/SET Patient? No      Pain Assessment   Currently in Pain? No/denies             Capillary Blood Glucose: No results found for this or any previous visit (from the past 24 hour(s)).    Social History   Tobacco Use  Smoking Status Former   Current packs/day: 0.00   Types: Cigarettes   Start date: 11/01/1977   Quit date: 11/02/2022   Years since quitting: 0.4   Passive exposure: Never  Smokeless Tobacco Never    Goals Met:  Independence with exercise equipment Exercise tolerated well No report of concerns or symptoms today Strength training completed today  Goals Unmet:  Not Applicable  Comments: Pt able to follow exercise prescription today without complaint.  Will continue to monitor for progression.    Dr. [name] is Medical Director for [facility] Cardiac Rehab

## 2023-04-18 ENCOUNTER — Encounter (HOSPITAL_COMMUNITY): Payer: BC Managed Care – PPO

## 2023-04-19 ENCOUNTER — Other Ambulatory Visit (HOSPITAL_COMMUNITY): Payer: Self-pay

## 2023-04-20 ENCOUNTER — Encounter (HOSPITAL_COMMUNITY)
Admission: RE | Admit: 2023-04-20 | Discharge: 2023-04-20 | Disposition: A | Discharge status: Home / Self Care | Payer: BC Managed Care – PPO | Source: Ambulatory Visit | Attending: Cardiology | Admitting: Cardiology

## 2023-04-20 ENCOUNTER — Encounter (HOSPITAL_COMMUNITY): Payer: BC Managed Care – PPO

## 2023-04-20 DIAGNOSIS — I2111 ST elevation (STEMI) myocardial infarction involving right coronary artery: Secondary | ICD-10-CM | POA: Insufficient documentation

## 2023-04-20 DIAGNOSIS — Z955 Presence of coronary angioplasty implant and graft: Secondary | ICD-10-CM | POA: Insufficient documentation

## 2023-04-20 NOTE — Progress Notes (Signed)
Daily Session Note  Patient Details  Name: Daniel Bautista MRN: [id number] Date of Birth: 1961-02-01 Referring Provider:   Flowsheet Row CARDIAC REHAB PHASE II ORIENTATION from 03/15/2023 in [facility] CARDIAC REHABILITATION  Referring Provider [name] MD       Encounter Date: 04/20/2023  Check In:  Session Check In - 04/20/23 0915       Check-In   Supervising physician immediately available to respond to emergencies See telemetry face sheet for immediately available MD    Staff Present Ross Ludwig, BS, Exercise Physiologist;Moneka Mcquinn Daphine Deutscher, RN, BSN;Jessica Hawkins, MA, RCEP, CCRP, CCET    Virtual Visit No    Medication changes reported     No    Fall or balance concerns reported    No    Tobacco Cessation No Change    Warm-up and Cool-down Performed on first and last piece of equipment    Resistance Training Performed Yes    VAD Patient? No      Pain Assessment   Currently in Pain? No/denies    Multiple Pain Sites No             Capillary Blood Glucose: No results found for this or any previous visit (from the past 24 hour(s)).    Social History   Tobacco Use  Smoking Status Former   Current packs/day: 0.00   Types: Cigarettes   Start date: 11/01/1977   Quit date: 11/02/2022   Years since quitting: 0.4   Passive exposure: Never  Smokeless Tobacco Never    Goals Met:  Independence with exercise equipment Exercise tolerated well No report of concerns or symptoms today Strength training completed today  Goals Unmet:  Not Applicable  Comments: Pt able to follow exercise prescription today without complaint.  Will continue to monitor for progression.    Dr. [name] is Medical Director for [facility] Cardiac Rehab

## 2023-04-22 ENCOUNTER — Encounter (HOSPITAL_COMMUNITY)
Admission: RE | Admit: 2023-04-22 | Discharge: 2023-04-22 | Disposition: A | Discharge status: Home / Self Care | Payer: BC Managed Care – PPO | Source: Ambulatory Visit | Attending: Cardiology | Admitting: Cardiology

## 2023-04-22 ENCOUNTER — Encounter (HOSPITAL_COMMUNITY): Payer: BC Managed Care – PPO

## 2023-04-22 DIAGNOSIS — I2111 ST elevation (STEMI) myocardial infarction involving right coronary artery: Secondary | ICD-10-CM

## 2023-04-22 DIAGNOSIS — Z955 Presence of coronary angioplasty implant and graft: Secondary | ICD-10-CM | POA: Diagnosis not present

## 2023-04-22 NOTE — Progress Notes (Signed)
Daily Session Note  Patient Details  Name: Daniel Bautista MRN: [id number] Date of Birth: December 06, 1960 Referring Provider:   Flowsheet Row CARDIAC REHAB PHASE II ORIENTATION from 03/15/2023 in [place] CARDIAC REHABILITATION  Referring Provider [name] MD       Encounter Date: 04/22/2023  Check In:  Session Check In - 04/22/23 0915       Check-In   Supervising physician immediately available to respond to emergencies See telemetry face sheet for immediately available MD    Location [facility]    Staff Present Mykaylah Ballman Daphine Deutscher, RN, BSN;Jessica Hawkins, MA, RCEP, CCRP, CCET    Virtual Visit No    Medication changes reported     No    Fall or balance concerns reported    No    Tobacco Cessation No Change    Warm-up and Cool-down Performed on first and last piece of equipment    Resistance Training Performed Yes    VAD Patient? No      Pain Assessment   Currently in Pain? No/denies             Capillary Blood Glucose: No results found for this or any previous visit (from the past 24 hour(s)).    Social History   Tobacco Use  Smoking Status Former   Current packs/day: 0.00   Types: Cigarettes   Start date: 11/01/1977   Quit date: 11/02/2022   Years since quitting: 0.4   Passive exposure: Never  Smokeless Tobacco Never    Goals Met:  Independence with exercise equipment Exercise tolerated well No report of concerns or symptoms today Strength training completed today  Goals Unmet:  Not Applicable  Comments: Pt able to follow exercise prescription today without complaint.  Will continue to monitor for progression.    Dr. [name] is Medical Director for [facility] Cardiac Rehab

## 2023-04-25 ENCOUNTER — Encounter (HOSPITAL_COMMUNITY): Payer: BC Managed Care – PPO

## 2023-04-25 ENCOUNTER — Encounter (HOSPITAL_COMMUNITY)
Admission: RE | Admit: 2023-04-25 | Discharge: 2023-04-25 | Disposition: A | Discharge status: Home / Self Care | Payer: BC Managed Care – PPO | Source: Ambulatory Visit | Attending: Cardiology | Admitting: Cardiology

## 2023-04-25 DIAGNOSIS — I2111 ST elevation (STEMI) myocardial infarction involving right coronary artery: Secondary | ICD-10-CM

## 2023-04-25 DIAGNOSIS — Z955 Presence of coronary angioplasty implant and graft: Secondary | ICD-10-CM

## 2023-04-25 NOTE — Progress Notes (Signed)
Daily Session Note  Patient Details  Name: Daniel Bautista MRN: [id number] Date of Birth: [birth year] Referring Provider:   Flowsheet Row CARDIAC REHAB PHASE II ORIENTATION from 03/15/2023 in [facility] CARDIAC REHABILITATION  Referring Provider [name] MD       Encounter Date: 04/25/2023  Check In:  Session Check In - 04/25/23 0915       Check-In   Supervising physician immediately available to respond to emergencies See telemetry face sheet for immediately available MD    Location [facility]    Staff Present Ross Ludwig, BS, Exercise Physiologist;Debra Laural Benes, RN, BSN;Phyllis Billingsley, RN    Virtual Visit No    Medication changes reported     No    Fall or balance concerns reported    No    Warm-up and Cool-down Performed on first and last piece of equipment    Resistance Training Performed Yes    VAD Patient? No    PAD/SET Patient? No      Pain Assessment   Currently in Pain? No/denies             Capillary Blood Glucose: No results found for this or any previous visit (from the past 24 hour(s)).    Social History   Tobacco Use  Smoking Status Former   Current packs/day: 0.00   Types: Cigarettes   Start date: 11/01/1977   Quit date: 11/02/2022   Years since quitting: 0.4   Passive exposure: Never  Smokeless Tobacco Never    Goals Met:  Independence with exercise equipment Exercise tolerated well No report of concerns or symptoms today Strength training completed today  Goals Unmet:  Not Applicable  Comments: Pt able to follow exercise prescription today without complaint.  Will continue to monitor for progression.    Dr. [name] is Medical Director for [facility] Cardiac Rehab

## 2023-04-27 ENCOUNTER — Encounter (HOSPITAL_COMMUNITY): Payer: BC Managed Care – PPO

## 2023-04-27 ENCOUNTER — Encounter (HOSPITAL_COMMUNITY)
Admission: RE | Admit: 2023-04-27 | Discharge: 2023-04-27 | Disposition: A | Discharge status: Home / Self Care | Payer: BC Managed Care – PPO | Source: Ambulatory Visit | Attending: Cardiology | Admitting: Cardiology

## 2023-04-27 ENCOUNTER — Encounter (HOSPITAL_COMMUNITY): Payer: Self-pay | Admitting: *Deleted

## 2023-04-27 DIAGNOSIS — I2111 ST elevation (STEMI) myocardial infarction involving right coronary artery: Secondary | ICD-10-CM

## 2023-04-27 DIAGNOSIS — Z955 Presence of coronary angioplasty implant and graft: Secondary | ICD-10-CM | POA: Diagnosis not present

## 2023-04-27 NOTE — Progress Notes (Signed)
Daily Session Note  Patient Details  Name: Daniel Bautista MRN: [id number] Date of Birth: [birth year] Referring Provider:   Flowsheet Row CARDIAC REHAB PHASE II ORIENTATION from 03/15/2023 in [facility] CARDIAC REHABILITATION  Referring Provider [name] MD       Encounter Date: 04/27/2023  Check In:  Session Check In - 04/27/23 0915       Check-In   Supervising physician immediately available to respond to emergencies [facility] MD immediately available    Physician(s) Dr. Diona Browner    Location [facility]    Staff Present Ross Ludwig, BS, Exercise Physiologist;Kaleisha Bhargava BSN, RN;Daphyne Daphine Deutscher, RN, BSN    Virtual Visit No    Medication changes reported     No    Fall or balance concerns reported    No    Tobacco Cessation No Change    Warm-up and Cool-down Performed on first and last piece of equipment    Resistance Training Performed Yes    VAD Patient? No    PAD/SET Patient? No      Pain Assessment   Currently in Pain? No/denies    Multiple Pain Sites No             Capillary Blood Glucose: No results found for this or any previous visit (from the past 24 hour(s)).    Social History   Tobacco Use  Smoking Status Former   Current packs/day: 0.00   Types: Cigarettes   Start date: 11/01/1977   Quit date: 11/02/2022   Years since quitting: 0.4   Passive exposure: Never  Smokeless Tobacco Never    Goals Met:  Independence with exercise equipment Exercise tolerated well No report of concerns or symptoms today Strength training completed today  Goals Unmet:  Not Applicable  Comments: Marland KitchenMarland KitchenPt able to follow exercise prescription today without complaint.  Will continue to monitor for progression.    Dr. [name] is Medical Director for [facility]

## 2023-04-27 NOTE — Progress Notes (Signed)
Cardiac Individual Treatment Plan  Patient Details  Name: Daniel Bautista MRN: [id number] Date of Birth: 1961/01/21 Referring Provider:   Flowsheet Row CARDIAC REHAB PHASE II ORIENTATION from 03/15/2023 in [facility] CARDIAC REHABILITATION  Referring Provider [name] MD       Initial Encounter Date:  Flowsheet Row CARDIAC REHAB PHASE II ORIENTATION from 03/15/2023 in [facility] [place] CARDIAC REHABILITATION  Date 03/15/23       Visit Diagnosis: ST elevation myocardial infarction involving right coronary artery [facility])  Status post coronary artery stent placement  Patient's Home Medications on Admission:  Current Outpatient Medications:    Accu-Chek Softclix Lancets lancets, Use as directed, Disp: 100 each, Rfl: 0   aspirin EC 81 MG tablet, Take 1 tablet (81 mg total) by mouth daily. Swallow whole., Disp: 90 tablet, Rfl: 3   atorvastatin (LIPITOR) 80 MG tablet, Take 1 tablet (80 mg total) by mouth daily., Disp: 90 tablet, Rfl: 3   Blood Glucose Monitoring Suppl (ACCU-CHEK GUIDE) w/Device KIT, Use up to four times daily as directed, Disp: 1 kit, Rfl: 0   dapagliflozin propanediol (FARXIGA) 10 MG TABS tablet, Take 1 tablet (10 mg total) by mouth daily before breakfast., Disp: 30 tablet, Rfl: 11   Evolocumab (REPATHA SURECLICK) 140 MG/ML SOAJ, Inject 140 mg into the skin every 14 (fourteen) days., Disp: 2 mL, Rfl: 11   glucose blood test strip, Use as directed, Disp: 100 each, Rfl: 0   insulin glargine-yfgn (SEMGLEE) 100 UNIT/ML Pen, Inject 12 Units into the skin daily., Disp: 15 mL, Rfl: 0   Insulin Pen Needle 32G X 4 MM MISC, Use daily, Disp: 100 each, Rfl: 3   metFORMIN (GLUCOPHAGE) 500 MG tablet, Take 1 tablet (500 mg total) by mouth daily with breakfast., Disp: 30 tablet, Rfl: 11   metoprolol succinate (TOPROL XL) 50 MG 24 hr tablet, Take 1 tablet (50 mg total) by mouth daily. Take with or immediately following a meal., Disp: 90 tablet, Rfl: 3   nitroGLYCERIN (NITROSTAT) 0.4 MG SL  tablet, Place 1 tablet (0.4 mg total) under the tongue every 5 (five) minutes x 3 doses as needed for chest pain., Disp: 25 tablet, Rfl: 0   sacubitril-valsartan (ENTRESTO) 49-51 MG, Take 1 tablet by mouth 2 (two) times daily., Disp: 180 tablet, Rfl: 3   spironolactone (ALDACTONE) 25 MG tablet, Take 1 tablet (25 mg total) by mouth daily., Disp: 90 tablet, Rfl: 3   ticagrelor (BRILINTA) 90 MG TABS tablet, Take 1 tablet (90 mg total) by mouth 2 (two) times daily., Disp: 180 tablet, Rfl: 1   tirzepatide (MOUNJARO) 5 MG/0.5ML Pen, Inject 5 mg into the skin once a week., Disp: 2 mL, Rfl: 0  Past Medical History: Past Medical History:  Diagnosis Date   Acute ST elevation myocardial infarction (STEMI) of inferior wall (HCC) 11/02/2022   Coronary artery disease involving native coronary artery of native heart with unstable angina pectoris (HCC) 11/03/2022   04/04/2023     Culprit lesion: Heavily thrombotic ulcerated /calcified prox-mid RCA 100% occlusion around crux after an RVM branch.  (After angioplasty and stenting there is a long area of the distal RCA into the PDA which is about 65% stenosed => improved some w/ NTG IC, so plan MedRx; OM1 90%, D1 80% (Med Rx).   Current smoker 11/03/2022   Hyperlipidemia associated with type 2 diabetes mellitus (HCC) 11/03/2022   Ischemic cardiomyopathy 11/03/2022   TTE 11/03/2022-s/p inferior STEMI: EF 35 to 40%.  Moderate reduced function.  Global HK.  GR 2 DD.  D-shaped IVS consistent with RV volume overload.  RV function mildly reduced, mildly enlarged.   Presence of drug coated stent in right coronary artery 11/03/2022   Overlapping Synergr DES 2.5 x 48 & 2.75 x 12 - tapered post-dilation 3.1-2.8-2.6 mm (prox-~distal RCA) in setting of Inf STEMI -100% prox RCA   Type 2 diabetes mellitus with complication, without long-term current use of insulin (HCC) 11/03/2022    Tobacco Use: Social History   Tobacco Use  Smoking Status Former   Current packs/day: 0.00    Types: Cigarettes   Start date: 11/01/1977   Quit date: 11/02/2022   Years since quitting: 0.4   Passive exposure: Never  Smokeless Tobacco Never    Labs: Review Flowsheet       Latest Ref Rng & Units 11/02/2022 11/03/2022 01/04/2023  Labs for ITP Cardiac and Pulmonary Rehab  Cholestrol 0 - 200 mg/dL 098  119  147   LDL (calc) 0 - 99 mg/dL 829  562  73   HDL-C >13 mg/dL 41  39  33   Trlycerides <150 mg/dL 086  578  469   Hemoglobin A1c 4.8 - 5.6 % 12.2  11.7  -    Details            Capillary Blood Glucose: Lab Results  Component Value Date   GLUCAP 163 (H) 04/01/2023   GLUCAP 216 (H) 04/01/2023   GLUCAP 134 (H) 03/23/2023   GLUCAP 187 (H) 03/23/2023   GLUCAP 221 (H) 03/21/2023     Exercise Target Goals: Exercise Program Goal: Individual exercise prescription set using results from initial 6 min walk test and THRR while considering  patient's activity barriers and safety.   Exercise Prescription Goal: Starting with aerobic activity 30 plus minutes a day, 3 days per week for initial exercise prescription. Provide home exercise prescription and guidelines that participant acknowledges understanding prior to discharge.  Activity Barriers & Risk Stratification:  Activity Barriers & Cardiac Risk Stratification - 03/14/23 1103       Activity Barriers & Cardiac Risk Stratification   Activity Barriers Deconditioning;Muscular Weakness;Back Problems   occassional back pain   Cardiac Risk Stratification Moderate             6 Minute Walk:  6 Minute Walk     Row Name 03/15/23 0957         6 Minute Walk   Phase Initial     Distance 1200 feet     Walk Time 6 minutes     # of Rest Breaks 0     MPH 2.27     METS 2.68     RPE 9     VO2 Peak 9.37     Symptoms No     Resting HR 57 bpm     Resting BP 104/58     Resting Oxygen Saturation  97 %     Exercise Oxygen Saturation  during 6 min walk 96 %     Max Ex. HR 113 bpm     Max Ex. BP 128/62     2 Minute Post  BP 106/64              Oxygen Initial Assessment:   Oxygen Re-Evaluation:   Oxygen Discharge (Final Oxygen Re-Evaluation):   Initial Exercise Prescription:  Initial Exercise Prescription - 03/15/23 1000       Date of Initial Exercise RX and Referring Provider   Date 03/15/23    Referring Provider  [name] MD      Oxygen   Maintain Oxygen Saturation 88% or higher      Treadmill   MPH 2.2    Grade 0.5    Minutes 15    METs 2.84      REL-XR   Level 2    Speed 50    Minutes 15    METs 2.5      Prescription Details   Frequency (times per week) 3    Duration Progress to 30 minutes of continuous aerobic without signs/symptoms of physical distress      Intensity   THRR 40-80% of Max Heartrate 97-138    Ratings of Perceived Exertion 11-13    Perceived Dyspnea 0-4      Progression   Progression Continue to progress workloads to maintain intensity without signs/symptoms of physical distress.      Resistance Training   Training Prescription Yes    Weight 4 lb    Reps 10-15             Perform Capillary Blood Glucose checks as needed.  Exercise Prescription Changes:   Exercise Prescription Changes     Row Name 03/15/23 1000 04/01/23 1300 04/25/23 1300         Response to Exercise   Blood Pressure (Admit) 104/58 92/60 90/60      Blood Pressure (Exercise) 128/62 100/52 --     Blood Pressure (Exit) 106/64 98/48 96/60      Heart Rate (Admit) 57 bpm 63 bpm 57 bpm     Heart Rate (Exercise) 113 bpm 105 bpm 93 bpm     Heart Rate (Exit) 60 bpm 66 bpm 64 bpm     Oxygen Saturation (Admit) 97 % -- --     Oxygen Saturation (Exercise) 96 % -- --     Rating of Perceived Exertion (Exercise) 9 11 12      Symptoms none -- --     Comments walk test results -- --     Duration -- Continue with 30 min of aerobic exercise without signs/symptoms of physical distress. Continue with 30 min of aerobic exercise without signs/symptoms of physical distress.     Intensity  -- THRR unchanged THRR unchanged       Progression   Progression -- Continue to progress workloads to maintain intensity without signs/symptoms of physical distress. Continue to progress workloads to maintain intensity without signs/symptoms of physical distress.       Resistance Training   Training Prescription -- Yes Yes     Weight -- 5 5     Reps -- 10-15 10-15       Treadmill   MPH -- 2.2 2.2     Grade -- 0 0.5     Minutes -- 15 15     METs -- 2.69 2.84       REL-XR   Level -- 2 3     Speed -- 56 56     Minutes -- 15 15     METs -- 3.4 3.2              Exercise Comments:   Exercise Comments     Row Name 03/21/23 1116           Exercise Comments First full day of exercise!  Patient was oriented to gym and equipment including functions, settings, policies, and procedures.  Patient's individual exercise prescription and treatment plan were reviewed.  All starting workloads were established based on the results of the  6 minute walk test done at initial orientation visit.  The plan for exercise progression was also introduced and progression will be customized based on patient's performance and goals.                Exercise Goals and Review:   Exercise Goals     Row Name 03/15/23 1004             Exercise Goals   Increase Physical Activity Yes       Intervention Provide advice, education, support and counseling about physical activity/exercise needs.;Develop an individualized exercise prescription for aerobic and resistive training based on initial evaluation findings, risk stratification, comorbidities and participant's personal goals.       Expected Outcomes Short Term: Attend rehab on a regular basis to increase amount of physical activity.;Long Term: Add in home exercise to make exercise part of routine and to increase amount of physical activity.;Long Term: Exercising regularly at least 3-5 days a week.       Increase Strength and Stamina Yes        Intervention Provide advice, education, support and counseling about physical activity/exercise needs.;Develop an individualized exercise prescription for aerobic and resistive training based on initial evaluation findings, risk stratification, comorbidities and participant's personal goals.       Expected Outcomes Short Term: Increase workloads from initial exercise prescription for resistance, speed, and METs.;Short Term: Perform resistance training exercises routinely during rehab and add in resistance training at home;Long Term: Improve cardiorespiratory fitness, muscular endurance and strength as measured by increased METs and functional capacity ( )       Able to understand and use rate of perceived exertion (RPE) scale Yes       Intervention Provide education and explanation on how to use RPE scale       Expected Outcomes Short Term: Able to use RPE daily in rehab to express subjective intensity level;Long Term:  Able to use RPE to guide intensity level when exercising independently       Able to understand and use Dyspnea scale Yes       Intervention Provide education and explanation on how to use Dyspnea scale       Expected Outcomes Short Term: Able to use Dyspnea scale daily in rehab to express subjective sense of shortness of breath during exertion;Long Term: Able to use Dyspnea scale to guide intensity level when exercising independently       Knowledge and understanding of Target Heart Rate Range (THRR) Yes       Intervention Provide education and explanation of THRR including how the numbers were predicted and where they are located for reference       Expected Outcomes Long Term: Able to use THRR to govern intensity when exercising independently;Short Term: Able to state/look up THRR;Short Term: Able to use daily as guideline for intensity in rehab       Able to check pulse independently Yes       Intervention Provide education and demonstration on how to check pulse in carotid and  radial arteries.;Review the importance of being able to check your own pulse for safety during independent exercise       Expected Outcomes Short Term: Able to explain why pulse checking is important during independent exercise;Long Term: Able to check pulse independently and accurately       Understanding of Exercise Prescription Yes       Intervention Provide education, explanation, and written materials on patient's individual exercise prescription  Expected Outcomes Short Term: Able to explain program exercise prescription;Long Term: Able to explain home exercise prescription to exercise independently                Exercise Goals Re-Evaluation :  Exercise Goals Re-Evaluation     Row Name 03/15/23 1005 04/01/23 1322 04/13/23 0923         Exercise Goal Re-Evaluation   Exercise Goals Review Able to understand and use rate of perceived exertion (RPE) scale;Able to understand and use Dyspnea scale;Understanding of Exercise Prescription Increase Physical Activity;Understanding of Exercise Prescription;Increase Strength and Stamina Increase Strength and Stamina;Understanding of Exercise Prescription;Increase Physical Activity     Comments Reviewed RPE  and dyspnea scale, and program prescription with pt today.  Pt voiced understanding and was given a copy of goals to take home. Daniel Bautista has not increased his workload or levels on his equipment in the last two weeks. He is exercising at level 2 on the XR and 2.2 speed on the treadmill. Will continue to monitor as able. Daniel Bautista has continued to increase his workload/levels on his equipment.  He is exercising at 2.2 speed and 0.5 incline on the treadmill and is going to try level 3 today on the ellpictical.  We will continue to monitor his progress.     Expected Outcomes Short: Use RPE daily to regulate intensity.  Long: Follow program prescription Short term: Increase level on the NuStep to level 3 in the next week   long term: continue to attend  cardiac rehab Short term:  Go over home exercise program  Long term:  Continue to increase his workload/levels while in rehab               Discharge Exercise Prescription (Final Exercise Prescription Changes):  Exercise Prescription Changes - 04/25/23 1300       Response to Exercise   Blood Pressure (Admit) 90/60    Blood Pressure (Exit) 96/60    Heart Rate (Admit) 57 bpm    Heart Rate (Exercise) 93 bpm    Heart Rate (Exit) 64 bpm    Rating of Perceived Exertion (Exercise) 12    Duration Continue with 30 min of aerobic exercise without signs/symptoms of physical distress.    Intensity THRR unchanged      Progression   Progression Continue to progress workloads to maintain intensity without signs/symptoms of physical distress.      Resistance Training   Training Prescription Yes    Weight 5    Reps 10-15      Treadmill   MPH 2.2    Grade 0.5    Minutes 15    METs 2.84      REL-XR   Level 3    Speed 56    Minutes 15    METs 3.2             Nutrition:  Target Goals: Understanding of nutrition guidelines, daily intake of sodium 1500mg , cholesterol 200mg , calories 30% from fat and 7% or less from saturated fats, daily to have 5 or more servings of fruits and vegetables.  Biometrics:  Pre Biometrics - 03/15/23 1005       Pre Biometrics   Height 5' 8.25" (1.734 m)    Weight 176 lb 14.4 oz (80.2 kg)    Waist Circumference 36 inches    Hip Circumference 37.5 inches    Waist to Hip Ratio 0.96 %    BMI (Calculated) 26.69    Grip Strength 30.7 kg  Single Leg Stand 10.2 seconds              Nutrition Therapy Plan and Nutrition Goals:  Nutrition Therapy & Goals - 03/14/23 1119       Intervention Plan   Intervention Prescribe, educate and counsel regarding individualized specific dietary modifications aiming towards targeted core components such as weight, hypertension, lipid management, diabetes, heart failure and other comorbidities.;Nutrition  handout(s) given to patient.    Expected Outcomes Short Term Goal: Understand basic principles of dietary content, such as calories, fat, sodium, cholesterol and nutrients.             Nutrition Assessments:  MEDIFICTS Score Key: >=70 Need to make dietary changes  40-70 Heart Healthy Diet <= 40 Therapeutic Level Cholesterol Diet  Flowsheet Row CARDIAC REHAB PHASE II ORIENTATION from 03/15/2023 in [facility] CARDIAC REHABILITATION  Picture Your Plate Total Score on Admission 48      Picture Your Plate Scores: <38 Unhealthy dietary pattern with much room for improvement. 41-50 Dietary pattern unlikely to meet recommendations for good health and room for improvement. 51-60 More healthful dietary pattern, with some room for improvement.  >60 Healthy dietary pattern, although there may be some specific behaviors that could be improved.    Nutrition Goals Re-Evaluation:  Nutrition Goals Re-Evaluation     Row Name 04/13/23 0933             Goals   Nutrition Goal Heart healthy diet       Comment Pt is currently trying to stay away from sweets and is also limiting his intake of red meat.  He eats a lot of grilled/baked chicken, but he states that he does not eat a lot of vegetables or fruits.  Pt states he has lost a few pounds since starting the program, and he has recently started on Mounjaro.  He currently checks his blood sugar every day.       Expected Outcome Short term: Add more fruit into his diet                  Long term:  Maintain a balance of fruit/vegetables every day in his diet                Nutrition Goals Discharge (Final Nutrition Goals Re-Evaluation):  Nutrition Goals Re-Evaluation - 04/13/23 0933       Goals   Nutrition Goal Heart healthy diet    Comment Pt is currently trying to stay away from sweets and is also limiting his intake of red meat.  He eats a lot of grilled/baked chicken, but he states that he does not eat a lot of vegetables or fruits.   Pt states he has lost a few pounds since starting the program, and he has recently started on Mounjaro.  He currently checks his blood sugar every day.    Expected Outcome Short term: Add more fruit into his diet                  Long term:  Maintain a balance of fruit/vegetables every day in his diet             Psychosocial: Target Goals: Acknowledge presence or absence of significant depression and/or stress, maximize coping skills, provide positive support system. Participant is able to verbalize types and ability to use techniques and skills needed for reducing stress and depression.  Initial Review & Psychosocial Screening:  Initial Psych Review & Screening - 03/14/23 1105  Initial Review   Current issues with Current Stress Concerns    Source of Stress Concerns Financial;Unable to participate in former interests or hobbies;Occupation;Retirement/disability    Comments Just retired from work on 7/25, quit smoking in March, takes his time, gets about 6 hr a night for sleep      Family Dynamics   Good Support System? Yes   wife     Barriers   Psychosocial barriers to participate in program The patient should benefit from training in stress management and relaxation.;Psychosocial barriers identified (see note)      Screening Interventions   Interventions Encouraged to exercise;Provide feedback about the scores to participant;To provide support and resources with identified psychosocial needs    Expected Outcomes Short Term goal: Utilizing psychosocial counselor, staff and physician to assist with identification of specific Stressors or current issues interfering with healing process. Setting desired goal for each stressor or current issue identified.;Long Term Goal: Stressors or current issues are controlled or eliminated.;Short Term goal: Identification and review with participant of any Quality of Life or Depression concerns found by scoring the questionnaire.;Long Term goal:  The participant improves quality of Life and PHQ9 Scores as seen by post scores and/or verbalization of changes             Quality of Life Scores:  Quality of Life - 03/15/23 1006       Quality of Life   Select Quality of Life      Quality of Life Scores   Health/Function Pre 24.8 %    Socioeconomic Pre 27.43 %    Psych/Spiritual Pre 30 %    Family Pre 30 %    GLOBAL Pre 27.18 %            Scores of 19 and below usually indicate a poorer quality of life in these areas.  A difference of  2-3 points is a clinically meaningful difference.  A difference of 2-3 points in the total score of the Quality of Life Index has been associated with significant improvement in overall quality of life, self-image, physical symptoms, and general health in studies assessing change in quality of life.  PHQ-9: Review Flowsheet       03/15/2023  Depression screen PHQ 2/9  Decreased Interest 0  Down, Depressed, Hopeless 0  PHQ - 2 Score 0  Altered sleeping 0  Tired, decreased energy 1  Change in appetite 2  Feeling bad or failure about yourself  0  Trouble concentrating 0  Moving slowly or fidgety/restless 0  Suicidal thoughts 0  PHQ-9 Score 3  Difficult doing work/chores Not difficult at all    Details           Interpretation of Total Score  Total Score Depression Severity:  1-4 = Minimal depression, 5-9 = Mild depression, 10-14 = Moderate depression, 15-19 = Moderately severe depression, 20-27 = Severe depression   Psychosocial Evaluation and Intervention:  Psychosocial Evaluation - 03/14/23 1111       Psychosocial Evaluation & Interventions   Interventions Encouraged to exercise with the program and follow exercise prescription;Stress management education    Comments Daniel Bautista is coming into cardiac rehab 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  He is doing well from a cardiac standpoint.  He has recently quit smoking after his heart attack and has not had any cravings.  He  is feeling good about his accomplishment and has been praised by all providers.  He offically retired last week to reduce stress  in his life and is feeling pretty good about it.  He tries not to let too much get to him.  It is just him and his wife who is a great supporter for him.  He sleeps well most nights.  He enjoys getting out to fish, but has not been able to do much with the heat this summer. He is not sure what to expect from program but would like to build stamina and feel a bit more confident after his heart event.    Expected Outcomes Short: Attend rehab to boost stamina Long: Build confidence to live life without worry about heart    Continue Psychosocial Services  Follow up required by staff             Psychosocial Re-Evaluation:  Psychosocial Re-Evaluation     Row Name 04/13/23 0929             Psychosocial Re-Evaluation   Current issues with Current Sleep Concerns       Comments Pt has a hx of only getting 6 hours of sleep due to having to get up and use the bathroom about every 2 hours.  He currently does not take any sleep medications and states that he does not take any medication for overactive bladder.       Expected Outcomes Pt will continue to have no identifiable identifiable psychosocial barriers and will discuss his bladder concerns with his primary care doctor as his next visit.       Interventions Stress management education;Relaxation education;Encouraged to attend Cardiac Rehabilitation for the exercise       Continue Psychosocial Services  Follow up required by staff                Psychosocial Discharge (Final Psychosocial Re-Evaluation):  Psychosocial Re-Evaluation - 04/13/23 0929       Psychosocial Re-Evaluation   Current issues with Current Sleep Concerns    Comments Pt has a hx of only getting 6 hours of sleep due to having to get up and use the bathroom about every 2 hours.  He currently does not take any sleep medications and states that he  does not take any medication for overactive bladder.    Expected Outcomes Pt will continue to have no identifiable identifiable psychosocial barriers and will discuss his bladder concerns with his primary care doctor as his next visit.    Interventions Stress management education;Relaxation education;Encouraged to attend Cardiac Rehabilitation for the exercise    Continue Psychosocial Services  Follow up required by staff             Vocational Rehabilitation: Provide vocational rehab assistance to qualifying candidates.   Vocational Rehab Evaluation & Intervention:  Vocational Rehab - 03/14/23 1104       Initial Vocational Rehab Evaluation & Intervention   Assessment shows need for Vocational Rehabilitation No   just retired Thursday  03/10/23            Education: Education Goals: Education classes will be provided on a weekly basis, covering required topics. Participant will state understanding/return demonstration of topics presented.  Learning Barriers/Preferences:  Learning Barriers/Preferences - 03/14/23 1102       Learning Barriers/Preferences   Learning Barriers Sight   glasses   Learning Preferences None             Education Topics: Hypertension, Hypertension Reduction -Define heart disease and high blood pressure. Discus how high blood pressure affects the body and ways to reduce high blood  pressure.   Exercise and Your Heart -Discuss why it is important to exercise, the FITT principles of exercise, normal and abnormal responses to exercise, and how to exercise safely.   Angina -Discuss definition of angina, causes of angina, treatment of angina, and how to decrease risk of having angina.   Cardiac Medications -Review what the following cardiac medications are used for, how they affect the body, and side effects that may occur when taking the medications.  Medications include Aspirin, Beta blockers, calcium channel blockers, ACE Inhibitors,  angiotensin receptor blockers, diuretics, digoxin, and antihyperlipidemics. Flowsheet Row CARDIAC REHAB PHASE II EXERCISE from 04/20/2023 in [place] CARDIAC REHABILITATION  Date 04/13/23  Educator DJ  Instruction Review Code 1- Verbalizes Understanding       Congestive Heart Failure -Discuss the definition of CHF, how to live with CHF, the signs and symptoms of CHF, and how keep track of weight and sodium intake.   Heart Disease and Intimacy -Discus the effect sexual activity has on the heart, how changes occur during intimacy as we age, and safety during sexual activity. Flowsheet Row CARDIAC REHAB PHASE II EXERCISE from 04/20/2023 in [place] CARDIAC REHABILITATION  Date 03/23/23  Educator [facility]  Instruction Review Code 1- Verbalizes Understanding       Smoking Cessation / COPD -Discuss different methods to quit smoking, the health benefits of quitting smoking, and the definition of COPD. Flowsheet Row CARDIAC REHAB PHASE II EXERCISE from 04/20/2023 in [place] CARDIAC REHABILITATION  Date 04/20/23  Educator [facility]  Instruction Review Code 2- Demonstrated Understanding       Nutrition I: Fats -Discuss the types of cholesterol, what cholesterol does to the heart, and how cholesterol levels can be controlled.   Nutrition II: Labels -Discuss the different components of food labels and how to read food label   Heart Parts/Heart Disease and PAD -Discuss the anatomy of the heart, the pathway of blood circulation through the heart, and these are affected by heart disease.   Stress I: Signs and Symptoms -Discuss the causes of stress, how stress may lead to anxiety and depression, and ways to limit stress. Flowsheet Row CARDIAC REHAB PHASE II EXERCISE from 04/20/2023 in [place] CARDIAC REHABILITATION  Date 04/06/23  Educator [facility]  Instruction Review Code 1- Verbalizes Understanding       Stress II: Relaxation -Discuss different types of relaxation techniques to limit  stress. Flowsheet Row CARDIAC REHAB PHASE II EXERCISE from 04/20/2023 in [place] CARDIAC REHABILITATION  Date 04/06/23  Educator [facility]  Instruction Review Code 1- Verbalizes Understanding       Warning Signs of Stroke / TIA -Discuss definition of a stroke, what the signs and symptoms are of a stroke, and how to identify when someone is having stroke.   Knowledge Questionnaire Score:  Knowledge Questionnaire Score - 03/15/23 1007       Knowledge Questionnaire Score   Pre Score 20/28             Core Components/Risk Factors/Patient Goals at Admission:  Personal Goals and Risk Factors at Admission - 03/15/23 1007       Core Components/Risk Factors/Patient Goals on Admission    Weight Management Yes;Weight Loss    Intervention Weight Management: Develop a combined nutrition and exercise program designed to reach desired caloric intake, while maintaining appropriate intake of nutrient and fiber, sodium and fats, and appropriate energy expenditure required for the weight goal.;Weight Management: Provide education and appropriate resources to help participant work on and attain  dietary goals.;Weight Management/Obesity: Establish reasonable short term and long term weight goals.    Admit Weight 176 lb 14.4 oz (80.2 kg)    Goal Weight: Short Term 171 lb (77.6 kg)    Goal Weight: Long Term 165 lb (74.8 kg)    Expected Outcomes Short Term: Continue to assess and modify interventions until short term weight is achieved;Long Term: Adherence to nutrition and physical activity/exercise program aimed toward attainment of established weight goal;Weight Loss: Understanding of general recommendations for a balanced deficit meal plan, which promotes 1-2 lb weight loss per week and includes a negative energy balance of [phone] kcal/d;Understanding recommendations for meals to include 15-35% energy as protein, 25-35% energy from fat, 35-60% energy from carbohydrates, less than 200mg  of dietary  cholesterol, 20-35 gm of total fiber daily;Understanding of distribution of calorie intake throughout the day with the consumption of 4-5 meals/snacks    Tobacco Cessation Yes   quit 11/02/22   Intervention Assist the participant in steps to quit. Provide individualized education and counseling about committing to Tobacco Cessation, relapse prevention, and pharmacological support that can be provided by physician.    Expected Outcomes Long Term: Complete abstinence from all tobacco products for at least 12 months from quit date.;Short Term: Will quit all tobacco product use, adhering to prevention of relapse plan.    Diabetes Yes    Intervention Provide education about signs/symptoms and action to take for hypo/hyperglycemia.;Provide education about proper nutrition, including hydration, and aerobic/resistive exercise prescription along with prescribed medications to achieve blood glucose in normal ranges: Fasting glucose 65-99 mg/dL    Expected Outcomes Short Term: Participant verbalizes understanding of the signs/symptoms and immediate care of hyper/hypoglycemia, proper foot care and importance of medication, aerobic/resistive exercise and nutrition plan for blood glucose control.;Long Term: Attainment of HbA1C < 7%.    Heart Failure Yes    Intervention Provide a combined exercise and nutrition program that is supplemented with education, support and counseling about heart failure. Directed toward relieving symptoms such as shortness of breath, decreased exercise tolerance, and extremity edema.    Expected Outcomes Improve functional capacity of life;Short term: Attendance in program 2-3 days a week with increased exercise capacity. Reported lower sodium intake. Reported increased fruit and vegetable intake. Reports medication compliance.;Short term: Daily weights obtained and reported for increase. Utilizing diuretic protocols set by physician.;Long term: Adoption of self-care skills and reduction of  barriers for early signs and symptoms recognition and intervention leading to self-care maintenance.    Hypertension Yes    Intervention Provide education on lifestyle modifcations including regular physical activity/exercise, weight management, moderate sodium restriction and increased consumption of fresh fruit, vegetables, and low fat dairy, alcohol moderation, and smoking cessation.;Monitor prescription use compliance.    Expected Outcomes Short Term: Continued assessment and intervention until BP is < 140/57mm HG in hypertensive participants. < 130/78mm HG in hypertensive participants with diabetes, heart failure or chronic kidney disease.;Long Term: Maintenance of blood pressure at goal levels.    Lipids Yes    Intervention Provide education and support for participant on nutrition & aerobic/resistive exercise along with prescribed medications to achieve LDL 70mg , HDL >40mg .    Expected Outcomes Short Term: Participant states understanding of desired cholesterol values and is compliant with medications prescribed. Participant is following exercise prescription and nutrition guidelines.;Long Term: Cholesterol controlled with medications as prescribed, with individualized exercise RX and with personalized nutrition plan. Value goals: LDL < 70mg , HDL > 40 mg.  Core Components/Risk Factors/Patient Goals Review:   Goals and Risk Factor Review     Row Name 03/14/23 1121 04/13/23 0944           Core Components/Risk Factors/Patient Goals Review   Personal Goals Review Tobacco Cessation Weight Management/Obesity;Heart Failure;Diabetes;Hypertension;Tobacco Cessation;Lipids      Review Daniel Bautista has recently quit tobacco use within the last 6 months. He quit at time of heart event.  Intervention for relapse prevention was provided at the initial medical review. He was encouraged to continue to with tobacco cessation and was provided information on relapse prevention. Patient received  information about combination therapy, tobacco cessation classes, quit line, and quit smoking apps in case of a relapse. Patient demonstrated understanding of this material.Staff will continue to provide encouragement and follow up with the patient throughout the program. Daniel Bautista quit smoking in March of 2024.  He currently weighs himself everyday and knows to look for swelling/sudden weight gain.  He checks his blood sugar everyday and was recently started on Mounjaro to also assist with weight loss.  The pt stated that he has lost a few pounds since starting cardiac rehab.  He is taking Lipitor for HLD and is taking this medication as prescribed with no reported side effects.  The pt also takes his Metoprolol and Spironolactone as prescribed with no reported side effects.      Expected Outcomes Continued cessation Short term:  continue smoking cessation   Long term:  Get an electronic BP cuff to monitor his blood pressure at home daily               Core Components/Risk Factors/Patient Goals at Discharge (Final Review):   Goals and Risk Factor Review - 04/13/23 0944       Core Components/Risk Factors/Patient Goals Review   Personal Goals Review Weight Management/Obesity;Heart Failure;Diabetes;Hypertension;Tobacco Cessation;Lipids    Review Daniel Bautista quit smoking in March of 2024.  He currently weighs himself everyday and knows to look for swelling/sudden weight gain.  He checks his blood sugar everyday and was recently started on Mounjaro to also assist with weight loss.  The pt stated that he has lost a few pounds since starting cardiac rehab.  He is taking Lipitor for HLD and is taking this medication as prescribed with no reported side effects.  The pt also takes his Metoprolol and Spironolactone as prescribed with no reported side effects.    Expected Outcomes Short term:  continue smoking cessation   Long term:  Get an electronic BP cuff to monitor his blood pressure at home daily              ITP Comments:  ITP Comments     Row Name 03/14/23 1120 03/15/23 0956 03/21/23 1116 03/30/23 0809 04/27/23 0804   ITP Comments Completed virtual orientation today.  EP evaluation is scheduled for 03/15/23 at 830.  Documentation for diagnosis can be found in [facility] encounter 11/02/22. Patient attend orientation today.  Patient is attendingCardiac Rehabilitation Program.  Documentation for diagnosis can be found in [facility] encounter 11/02/22.  Reviewed medical chart, RPE/RPD, gym safety, and program guidelines.  Patient was fitted to equipment they will be using during rehab.  Patient is scheduled to start exercise on 03/21/23 at 930.   Initial ITP created and sent for review and signature by Dr. [name], Medical Director for Cardiac Rehabilitation Program. First full day of exercise!  Patient was oriented to gym and equipment including functions, settings, policies, and procedures.  Patient's individual exercise prescription  and treatment plan were reviewed.  All starting workloads were established based on the results of the 6 minute walk test done at initial orientation visit.  The plan for exercise progression was also introduced and progression will be customized based on patient's performance and goals. 30 day review completed. ITP sent to Dr. [name], Medical Director of Cardiac Rehab. Continue with ITP unless changes are made by physician. 30 day review completed. ITP sent to Dr. [name], Medical Director of Cardiac Rehab. Continue with ITP unless changes are made by physician.            Comments: 30 day review

## 2023-04-29 ENCOUNTER — Encounter (HOSPITAL_COMMUNITY): Payer: BC Managed Care – PPO

## 2023-05-02 ENCOUNTER — Encounter (HOSPITAL_COMMUNITY): Payer: BC Managed Care – PPO

## 2023-05-02 ENCOUNTER — Encounter (HOSPITAL_COMMUNITY)
Admission: RE | Admit: 2023-05-02 | Discharge: 2023-05-02 | Disposition: A | Discharge status: Home / Self Care | Payer: BC Managed Care – PPO | Source: Ambulatory Visit | Attending: Cardiology | Admitting: Cardiology

## 2023-05-02 DIAGNOSIS — I2111 ST elevation (STEMI) myocardial infarction involving right coronary artery: Secondary | ICD-10-CM

## 2023-05-02 DIAGNOSIS — Z955 Presence of coronary angioplasty implant and graft: Secondary | ICD-10-CM

## 2023-05-02 NOTE — Progress Notes (Signed)
Daily Session Note  Patient Details  Name: DEMARIUS Bautista MRN: [id number] Date of Birth: July 02, 1961 Referring Provider:   Flowsheet Row CARDIAC REHAB PHASE II ORIENTATION from 03/15/2023 in [facility] CARDIAC REHABILITATION  Referring Provider Daniel Ancona MD       Encounter Date: 05/02/2023  Check In:  Session Check In - 05/02/23 0900       Check-In   Supervising physician immediately available to respond to emergencies See telemetry face sheet for immediately available ER MD    Staff Present Daniel Medin, RN, BSN;Daniel Bautista, BS, Exercise Physiologist;Daniel Juanetta Gosling, MA, RCEP, CCRP, CCET    Virtual Visit No    Medication changes reported     No    Fall or balance concerns reported    No    Tobacco Cessation No Change    Warm-up and Cool-down Performed on first and last piece of equipment    Resistance Training Performed Yes    VAD Patient? No    PAD/SET Patient? No      Pain Assessment   Currently in Pain? No/denies    Multiple Pain Sites No             Capillary Blood Glucose: No results found for this or any previous visit (from the past 24 hour(s)).    Social History   Tobacco Use  Smoking Status Former   Current packs/day: 0.00   Types: Cigarettes   Start date: 11/01/1977   Quit date: 11/02/2022   Years since quitting: 0.4   Passive exposure: Never  Smokeless Tobacco Never    Goals Met:  Independence with exercise equipment Exercise tolerated well No report of concerns or symptoms today Strength training completed today  Goals Unmet:  Not Applicable  Comments: Pt able to follow exercise prescription today without complaint.  Will continue to monitor for progression.    Dr. Dina Bautista is Medical Director for [facility] Cardiac Rehab

## 2023-05-02 NOTE — Progress Notes (Signed)
Reviewed home exercise with pt today.  Pt plans to walk at home for exercise.  Reviewed THR, pulse, RPE, sign and symptoms, pulse oximetery and when to call 911 or MD.  Also discussed weather considerations and indoor options.  Pt voiced understanding.

## 2023-05-04 ENCOUNTER — Encounter (HOSPITAL_COMMUNITY)
Admission: RE | Admit: 2023-05-04 | Discharge: 2023-05-04 | Disposition: A | Discharge status: Home / Self Care | Payer: BC Managed Care – PPO | Source: Ambulatory Visit | Attending: Cardiology | Admitting: Cardiology

## 2023-05-04 ENCOUNTER — Encounter (HOSPITAL_COMMUNITY): Payer: BC Managed Care – PPO

## 2023-05-04 DIAGNOSIS — Z955 Presence of coronary angioplasty implant and graft: Secondary | ICD-10-CM

## 2023-05-04 DIAGNOSIS — I2111 ST elevation (STEMI) myocardial infarction involving right coronary artery: Secondary | ICD-10-CM

## 2023-05-04 NOTE — Progress Notes (Signed)
Daily Session Note  Patient Details  Name: Daniel Bautista MRN: [id number] Date of Birth: 08/25/1960 Referring Provider:   Flowsheet Row CARDIAC REHAB PHASE II ORIENTATION from 03/15/2023 in [facility] CARDIAC REHABILITATION  Referring Provider [name] MD       Encounter Date: 05/04/2023  Check In:  Session Check In - 05/04/23 0915       Check-In   Supervising physician immediately available to respond to emergencies [facility] MD immediately available    Physician(s) Dr. Jenene Slicker    Location [facility]    Staff Present Ross Ludwig, BS, Exercise Physiologist;Jodene Polyak BSN, RN;Jessica Springfield, MA, RCEP, CCRP, CCET    Virtual Visit No    Medication changes reported     No    Fall or balance concerns reported    No    Tobacco Cessation No Change    Warm-up and Cool-down Performed on first and last piece of equipment    Resistance Training Performed Yes    VAD Patient? No    PAD/SET Patient? No      Pain Assessment   Currently in Pain? No/denies    Multiple Pain Sites No             Capillary Blood Glucose: No results found for this or any previous visit (from the past 24 hour(s)).    Social History   Tobacco Use  Smoking Status Former   Current packs/day: 0.00   Types: Cigarettes   Start date: 11/01/1977   Quit date: 11/02/2022   Years since quitting: 0.5   Passive exposure: Never  Smokeless Tobacco Never    Goals Met:  Independence with exercise equipment Exercise tolerated well No report of concerns or symptoms today Strength training completed today  Goals Unmet:  Not Applicable  Comments: Marland KitchenMarland KitchenPt able to follow exercise prescription today without complaint.  Will continue to monitor for progression.    Dr. [name] is Medical Director for [facility] Cardiac Rehab

## 2023-05-06 ENCOUNTER — Encounter (HOSPITAL_COMMUNITY): Payer: BC Managed Care – PPO

## 2023-05-06 ENCOUNTER — Encounter (HOSPITAL_COMMUNITY)
Admission: RE | Admit: 2023-05-06 | Discharge: 2023-05-06 | Disposition: A | Discharge status: Home / Self Care | Payer: BC Managed Care – PPO | Source: Ambulatory Visit | Attending: Cardiology | Admitting: Cardiology

## 2023-05-06 DIAGNOSIS — Z955 Presence of coronary angioplasty implant and graft: Secondary | ICD-10-CM

## 2023-05-06 DIAGNOSIS — I2111 ST elevation (STEMI) myocardial infarction involving right coronary artery: Secondary | ICD-10-CM | POA: Diagnosis not present

## 2023-05-06 NOTE — Progress Notes (Signed)
Daily Session Note  Patient Details  Name: Daniel Bautista MRN: [id number] Date of Birth: 1961/05/31 Referring Provider:   Flowsheet Row CARDIAC REHAB PHASE II ORIENTATION from 03/15/2023 in [facility] CARDIAC REHABILITATION  Referring Provider [name] MD       Encounter Date: 05/06/2023  Check In:  Session Check In - 05/06/23 0915       Check-In   Supervising physician immediately available to respond to emergencies See telemetry face sheet for immediately available MD    Location [facility]    Staff Present Ross Ludwig, BS, Exercise Physiologist;Daphyne Daphine Deutscher, RN, BSN;Jessica Hawkins, MA, RCEP, CCRP, CCET    Virtual Visit No    Medication changes reported     No    Fall or balance concerns reported    No    Tobacco Cessation No Change    Warm-up and Cool-down Performed on first and last piece of equipment    Resistance Training Performed Yes    VAD Patient? No    PAD/SET Patient? No      Pain Assessment   Currently in Pain? No/denies    Multiple Pain Sites No             Capillary Blood Glucose: No results found for this or any previous visit (from the past 24 hour(s)).    Social History   Tobacco Use  Smoking Status Former   Current packs/day: 0.00   Types: Cigarettes   Start date: 11/01/1977   Quit date: 11/02/2022   Years since quitting: 0.5   Passive exposure: Never  Smokeless Tobacco Never    Goals Met:  Independence with exercise equipment Exercise tolerated well No report of concerns or symptoms today Strength training completed today  Goals Unmet:  Not Applicable  Comments: Pt able to follow exercise prescription today without complaint.  Will continue to monitor for progression.    Dr. [name] is Medical Director for [facility] Cardiac Rehab

## 2023-05-09 ENCOUNTER — Encounter (HOSPITAL_COMMUNITY)
Admission: RE | Admit: 2023-05-09 | Discharge: 2023-05-09 | Disposition: A | Discharge status: Home / Self Care | Payer: BC Managed Care – PPO | Source: Ambulatory Visit | Attending: Cardiology | Admitting: Cardiology

## 2023-05-09 ENCOUNTER — Encounter (HOSPITAL_COMMUNITY): Payer: BC Managed Care – PPO

## 2023-05-09 DIAGNOSIS — Z955 Presence of coronary angioplasty implant and graft: Secondary | ICD-10-CM | POA: Diagnosis not present

## 2023-05-09 DIAGNOSIS — I2111 ST elevation (STEMI) myocardial infarction involving right coronary artery: Secondary | ICD-10-CM | POA: Diagnosis not present

## 2023-05-09 NOTE — Progress Notes (Signed)
Daily Session Note  Patient Details  Name: Daniel Bautista MRN: [id number] Date of Birth: 06/07/1961 Referring Provider:   Flowsheet Row CARDIAC REHAB PHASE II ORIENTATION from 03/15/2023 in [facility] CARDIAC REHABILITATION  Referring Provider [name] MD       Encounter Date: 05/09/2023  Check In:  Session Check In - 05/09/23 0915       Check-In   Supervising physician immediately available to respond to emergencies See telemetry face sheet for immediately available MD    Location [facility]    Staff Present Ross Ludwig, BS, Exercise Physiologist;Debra Laural Benes, RN, BSN;Dennison Mcdaid, RN;Daphyne Daphine Deutscher, RN, BSN;Jessica Hawkins, MA, RCEP, CCRP, CCET    Virtual Visit No    Medication changes reported     No    Fall or balance concerns reported    No    Tobacco Cessation No Change    Warm-up and Cool-down Performed on first and last piece of equipment    Resistance Training Performed Yes    VAD Patient? No    PAD/SET Patient? No      Pain Assessment   Currently in Pain? No/denies    Multiple Pain Sites No             Capillary Blood Glucose: No results found for this or any previous visit (from the past 24 hour(s)).    Social History   Tobacco Use  Smoking Status Former   Current packs/day: 0.00   Types: Cigarettes   Start date: 11/01/1977   Quit date: 11/02/2022   Years since quitting: 0.5   Passive exposure: Never  Smokeless Tobacco Never    Goals Met:  Independence with exercise equipment Exercise tolerated well No report of concerns or symptoms today Strength training completed today  Goals Unmet:  Not Applicable  Comments: Pt able to follow exercise prescription today without complaint.  Will continue to monitor for progression.    Dr. [name] is Medical Director for [facility] Cardiac Rehab

## 2023-05-11 ENCOUNTER — Encounter (HOSPITAL_COMMUNITY): Payer: BC Managed Care – PPO

## 2023-05-11 ENCOUNTER — Encounter (HOSPITAL_COMMUNITY)
Admission: RE | Admit: 2023-05-11 | Discharge: 2023-05-11 | Disposition: A | Discharge status: Home / Self Care | Payer: BC Managed Care – PPO | Source: Ambulatory Visit | Attending: Cardiology | Admitting: Cardiology

## 2023-05-11 DIAGNOSIS — Z955 Presence of coronary angioplasty implant and graft: Secondary | ICD-10-CM | POA: Diagnosis not present

## 2023-05-11 DIAGNOSIS — I2111 ST elevation (STEMI) myocardial infarction involving right coronary artery: Secondary | ICD-10-CM | POA: Diagnosis not present

## 2023-05-11 NOTE — Progress Notes (Signed)
Daily Session Note  Patient Details  Name: YUYA DELLIS MRN: [id number] Date of Birth: [birth year] Referring Provider:   Flowsheet Row CARDIAC REHAB PHASE II ORIENTATION from 03/15/2023 in [facility] CARDIAC REHABILITATION  Referring Provider [name] MD       Encounter Date: 05/11/2023  Check In:  Session Check In - 05/11/23 0915       Check-In   Supervising physician immediately available to respond to emergencies [facility] MD immediately available    Physician(s) Dr. Wyline Mood    Location [facility]    Staff Present Ross Ludwig, BS, Exercise Physiologist;Debra Laural Benes, RN, Pleas Koch, RN, BSN;Jessica Hawkins, MA, RCEP, CCRP, CCET;Damyon Mullane BSN, RN    Virtual Visit No    Medication changes reported     No    Tobacco Cessation No Change    Warm-up and Cool-down Performed on first and last piece of equipment    Resistance Training Performed Yes    VAD Patient? No    PAD/SET Patient? No      Pain Assessment   Currently in Pain? No/denies    Multiple Pain Sites No             Capillary Blood Glucose: No results found for this or any previous visit (from the past 24 hour(s)).    Social History   Tobacco Use  Smoking Status Former   Current packs/day: 0.00   Types: Cigarettes   Start date: 11/01/1977   Quit date: 11/02/2022   Years since quitting: 0.5   Passive exposure: Never  Smokeless Tobacco Never    Goals Met:  Independence with exercise equipment Exercise tolerated well No report of concerns or symptoms today Strength training completed today  Goals Unmet:  Not Applicable  Comments: Marland KitchenMarland KitchenPt able to follow exercise prescription today without complaint.  Will continue to monitor for progression.    Dr. [name] is Medical Director for [facility] Cardiac Rehab

## 2023-05-12 ENCOUNTER — Telehealth: Payer: Self-pay | Admitting: Pharmacist

## 2023-05-12 DIAGNOSIS — E1159 Type 2 diabetes mellitus with other circulatory complications: Secondary | ICD-10-CM

## 2023-05-12 MED ORDER — BLOOD GLUCOSE MONITORING SUPPL KIT
1.0000 [IU] | PACK | Freq: Every day | 0 refills | Status: AC
Start: 2023-05-12 — End: ?

## 2023-05-12 MED ORDER — BLOOD GLUCOSE METER KIT
PACK | 0 refills | Status: AC
Start: 2023-05-12 — End: ?

## 2023-05-12 MED ORDER — MOUNJARO 7.5 MG/0.5ML ~~LOC~~ SOAJ
7.5000 mg | SUBCUTANEOUS | 0 refills | Status: DC
Start: 2023-05-12 — End: 2023-06-06

## 2023-05-12 NOTE — Addendum Note (Signed)
Addended by: Tylene Fantasia on: 05/12/2023 04:54 PM   Modules accepted: Orders

## 2023-05-12 NOTE — Telephone Encounter (Signed)
Patient called back, reports his FBG ~ 125 he is using 10 units of basal insulin as per our last conversation. Tolerates Mounjaro 5 mg once week dose well.  Will titrate Mounjaro to 7.5 mg one week and discontinue basal insulin.Patient voiced understanding.

## 2023-05-12 NOTE — Telephone Encounter (Signed)
Call to titrate Mounjaro dose from 5 mg to 7.5 mg and d/c basal insulin if BG at goal.  N/A LVM.

## 2023-05-13 ENCOUNTER — Encounter (HOSPITAL_COMMUNITY): Payer: BC Managed Care – PPO

## 2023-05-13 ENCOUNTER — Encounter (HOSPITAL_COMMUNITY)
Admission: RE | Admit: 2023-05-13 | Discharge: 2023-05-13 | Disposition: A | Discharge status: Home / Self Care | Payer: BC Managed Care – PPO | Source: Ambulatory Visit | Attending: Cardiology | Admitting: Cardiology

## 2023-05-13 DIAGNOSIS — I2111 ST elevation (STEMI) myocardial infarction involving right coronary artery: Secondary | ICD-10-CM | POA: Diagnosis not present

## 2023-05-13 DIAGNOSIS — Z955 Presence of coronary angioplasty implant and graft: Secondary | ICD-10-CM | POA: Diagnosis not present

## 2023-05-13 MED ORDER — ONETOUCH VERIO VI STRP: ORAL_STRIP | 12 refills | Status: AC

## 2023-05-13 NOTE — Telephone Encounter (Signed)
Patient called requesting to send right kind of BG test strips. Prescription for One Touch Verio 100 qty with 11 refills sent to [facility] as per patient's request

## 2023-05-13 NOTE — Addendum Note (Signed)
Addended by: Tylene Fantasia on: 05/13/2023 03:49 PM   Modules accepted: Orders

## 2023-05-13 NOTE — Progress Notes (Signed)
Daily Session Note  Patient Details  Name: Daniel Bautista MRN: [id number] Date of Birth: [birth year] Referring Provider:   Flowsheet Row CARDIAC REHAB PHASE II ORIENTATION from 03/15/2023 in [facility] CARDIAC REHABILITATION  Referring Provider [name] MD       Encounter Date: 05/13/2023  Check In:  Session Check In - 05/13/23 0915       Check-In   Supervising physician immediately available to respond to emergencies See telemetry face sheet for immediately available MD    Location [facility]    Staff Present Ross Ludwig, BS, Exercise Physiologist;Debra Laural Benes, RN, BSN;Jessica Hawkins, MA, RCEP, CCRP, CCET    Virtual Visit No    Medication changes reported     No    Fall or balance concerns reported    No    Tobacco Cessation No Change    Warm-up and Cool-down Performed on first and last piece of equipment    Resistance Training Performed Yes    VAD Patient? No    PAD/SET Patient? No      Pain Assessment   Currently in Pain? No/denies    Multiple Pain Sites No             Capillary Blood Glucose: No results found for this or any previous visit (from the past 24 hour(s)).    Social History   Tobacco Use  Smoking Status Former   Current packs/day: 0.00   Types: Cigarettes   Start date: 11/01/1977   Quit date: 11/02/2022   Years since quitting: 0.5   Passive exposure: Never  Smokeless Tobacco Never    Goals Met:  Independence with exercise equipment Exercise tolerated well No report of concerns or symptoms today Strength training completed today  Goals Unmet:  Not Applicable  Comments: Pt able to follow exercise prescription today without complaint.  Will continue to monitor for progression.    Dr. [name] is Medical Director for [facility] Cardiac Re[facility]

## 2023-05-16 ENCOUNTER — Encounter (HOSPITAL_COMMUNITY)
Admission: RE | Admit: 2023-05-16 | Discharge: 2023-05-16 | Disposition: A | Discharge status: Home / Self Care | Payer: BC Managed Care – PPO | Source: Ambulatory Visit | Attending: Cardiology | Admitting: Cardiology

## 2023-05-16 ENCOUNTER — Encounter (HOSPITAL_COMMUNITY): Payer: BC Managed Care – PPO

## 2023-05-16 DIAGNOSIS — Z955 Presence of coronary angioplasty implant and graft: Secondary | ICD-10-CM

## 2023-05-16 DIAGNOSIS — I2111 ST elevation (STEMI) myocardial infarction involving right coronary artery: Secondary | ICD-10-CM

## 2023-05-16 NOTE — Progress Notes (Signed)
Daily Session Note  Patient Details  Name: Daniel Bautista MRN: [id number] Date of Birth: Dec 16, 1960 Referring Provider:   Flowsheet Row CARDIAC REHAB PHASE II ORIENTATION from 03/15/2023 in [facility] CARDIAC REHABILITATION  Referring Provider [name] MD       Encounter Date: 05/16/2023  Check In:  Session Check In - 05/16/23 0915       Check-In   Supervising physician immediately available to respond to emergencies See telemetry face sheet for immediately available MD    Location [facility]    Staff Present Ross Ludwig, BS, Exercise Physiologist;Jessica Juanetta Gosling, MA, RCEP, CCRP, Dow Adolph, RN, BSN    Virtual Visit No    Medication changes reported     No    Fall or balance concerns reported    No    Tobacco Cessation No Change    Warm-up and Cool-down Performed on first and last piece of equipment    Resistance Training Performed Yes    VAD Patient? No    PAD/SET Patient? No      Pain Assessment   Currently in Pain? No/denies    Multiple Pain Sites No             Capillary Blood Glucose: No results found for this or any previous visit (from the past 24 hour(s)).    Social History   Tobacco Use  Smoking Status Former   Current packs/day: 0.00   Types: Cigarettes   Start date: 11/01/1977   Quit date: 11/02/2022   Years since quitting: 0.5   Passive exposure: Never  Smokeless Tobacco Never    Goals Met:  Independence with exercise equipment Exercise tolerated well No report of concerns or symptoms today Strength training completed today  Goals Unmet:  Not Applicable  Comments: Pt able to follow exercise prescription today without complaint.  Will continue to monitor for progression.    Dr. [name] is Medical Director for [facility] Cardiac Rehab

## 2023-05-18 ENCOUNTER — Encounter (HOSPITAL_COMMUNITY): Payer: BC Managed Care – PPO

## 2023-05-18 ENCOUNTER — Encounter (HOSPITAL_COMMUNITY)
Admission: RE | Admit: 2023-05-18 | Discharge: 2023-05-18 | Disposition: A | Discharge status: Home / Self Care | Payer: BC Managed Care – PPO | Source: Ambulatory Visit | Attending: Cardiology | Admitting: Cardiology

## 2023-05-18 DIAGNOSIS — Z955 Presence of coronary angioplasty implant and graft: Secondary | ICD-10-CM | POA: Diagnosis not present

## 2023-05-18 DIAGNOSIS — I2111 ST elevation (STEMI) myocardial infarction involving right coronary artery: Secondary | ICD-10-CM | POA: Diagnosis not present

## 2023-05-18 NOTE — Progress Notes (Signed)
Daily Session Note  Patient Details  Name: Daniel Bautista MRN: [id number] Date of Birth: Nov 10, 1960 Referring Provider:   Flowsheet Row CARDIAC REHAB PHASE II ORIENTATION from 03/15/2023 in [facility] CARDIAC REHABILITATION  Referring Provider [name] MD       Encounter Date: 05/18/2023  Check In:  Session Check In - 05/18/23 0918       Check-In   Supervising physician immediately available to respond to emergencies See telemetry face sheet for immediately available MD    Location [facility]    Staff Present Ross Ludwig, BS, Exercise Physiologist;Keaghan Bowens [place] BSN, RN;Debra Laural Benes, RN, BSN;Jessica Hawkins, MA, RCEP, CCRP, CCET    Virtual Visit No    Medication changes reported     No    Fall or balance concerns reported    No    Tobacco Cessation No Change    Warm-up and Cool-down Performed on first and last piece of equipment    Resistance Training Performed Yes    VAD Patient? No    PAD/SET Patient? No      Pain Assessment   Currently in Pain? No/denies    Multiple Pain Sites No             Capillary Blood Glucose: No results found for this or any previous visit (from the past 24 hour(s)).    Social History   Tobacco Use  Smoking Status Former   Current packs/day: 0.00   Types: Cigarettes   Start date: 11/01/1977   Quit date: 11/02/2022   Years since quitting: 0.5   Passive exposure: Never  Smokeless Tobacco Never    Goals Met:  Independence with exercise equipment Exercise tolerated well No report of concerns or symptoms today Strength training completed today  Goals Unmet:  Not Applicable  Comments: Marland KitchenMarland KitchenPt able to follow exercise prescription today without complaint.  Will continue to monitor for progression.    Dr. [name] is Medical Director for [facility] Cardiac Rehab

## 2023-05-20 ENCOUNTER — Encounter (HOSPITAL_COMMUNITY)
Admission: RE | Admit: 2023-05-20 | Discharge: 2023-05-20 | Disposition: A | Discharge status: Home / Self Care | Payer: BC Managed Care – PPO | Source: Ambulatory Visit | Attending: Cardiology | Admitting: Cardiology

## 2023-05-20 ENCOUNTER — Encounter (HOSPITAL_COMMUNITY): Payer: BC Managed Care – PPO

## 2023-05-20 VITALS — Ht 68.25 in | Wt 171.4 lb

## 2023-05-20 DIAGNOSIS — I2111 ST elevation (STEMI) myocardial infarction involving right coronary artery: Secondary | ICD-10-CM | POA: Diagnosis not present

## 2023-05-20 DIAGNOSIS — Z955 Presence of coronary angioplasty implant and graft: Secondary | ICD-10-CM | POA: Diagnosis not present

## 2023-05-20 NOTE — Progress Notes (Signed)
Daily Session Note  Patient Details  Name: Daniel Bautista MRN: [id number] Date of Birth: [birth year] Referring Provider:   Flowsheet Row CARDIAC REHAB PHASE II ORIENTATION from 03/15/2023 in [facility] CARDIAC REHABILITATION  Referring Provider [name] MD       Encounter Date: 05/20/2023  Check In:  Session Check In - 05/20/23 0915       Check-In   Supervising physician immediately available to respond to emergencies See telemetry face sheet for immediately available ER MD    Location [facility]    Staff Present Rodena Medin, RN, BSN;Jessica Juanetta Gosling, MA, RCEP, CCRP, CCET;[employer], RN    Virtual Visit No    Medication changes reported     No    Fall or balance concerns reported    No    Warm-up and Cool-down Performed on first and last piece of equipment    Resistance Training Performed Yes    VAD Patient? No    PAD/SET Patient? No      Pain Assessment   Currently in Pain? No/denies    Multiple Pain Sites No             Capillary Blood Glucose: No results found for this or any previous visit (from the past 24 hour(s)).    Social History   Tobacco Use  Smoking Status Former   Current packs/day: 0.00   Types: Cigarettes   Start date: 11/01/1977   Quit date: 11/02/2022   Years since quitting: 0.5   Passive exposure: Never  Smokeless Tobacco Never    Goals Met:  Independence with exercise equipment Exercise tolerated well No report of concerns or symptoms today Strength training completed today  Goals Unmet:  Not Applicable  Comments: Pt able to follow exercise prescription today without complaint.  Will continue to monitor for progression.    Dr. [name] is Medical Director for [facility] Cardiac Rehab

## 2023-05-20 NOTE — Patient Instructions (Signed)
Discharge Patient Instructions  Patient Details  Name: Daniel Bautista MRN: [id number] Date of Birth: [birth year] Referring Provider:  No ref. provider found   Number of Visits: 36  Reason for Discharge:  Patient reached a stable level of exercise. Patient independent in their exercise. Patient has met program and personal goals.  Smoking History:  Social History   Tobacco Use  Smoking Status Former   Current packs/day: 0.00   Types: Cigarettes   Start date: 11/01/1977   Quit date: 11/02/2022   Years since quitting: 0.5   Passive exposure: Never  Smokeless Tobacco Never    Diagnosis:  ST elevation myocardial infarction involving right coronary artery (HCC)  Status post coronary artery stent placement  Initial Exercise Prescription:  Initial Exercise Prescription - 03/15/23 1000       Date of Initial Exercise RX and Referring Provider   Date 03/15/23    Referring Provider [name] MD      Oxygen   Maintain Oxygen Saturation 88% or higher      Treadmill   MPH 2.2    Grade 0.5    Minutes 15    METs 2.84      REL-XR   Level 2    Speed 50    Minutes 15    METs 2.5      Prescription Details   Frequency (times per week) 3    Duration Progress to 30 minutes of continuous aerobic without signs/symptoms of physical distress      Intensity   THRR 40-80% of Max Heartrate 97-138    Ratings of Perceived Exertion 11-13    Perceived Dyspnea 0-4      Progression   Progression Continue to progress workloads to maintain intensity without signs/symptoms of physical distress.      Resistance Training   Training Prescription Yes    Weight 4 lb    Reps 10-15             Discharge Exercise Prescription (Final Exercise Prescription Changes):  Exercise Prescription Changes - 05/09/23 1300       Response to Exercise   Blood Pressure (Admit) 106/64    Blood Pressure (Exit) 98/90    Heart Rate (Admit) 58 bpm    Heart Rate (Exercise) 96 bpm    Heart Rate  (Exit) 68 bpm    Rating of Perceived Exertion (Exercise) 12    Duration Continue with 30 min of aerobic exercise without signs/symptoms of physical distress.    Intensity THRR unchanged      Progression   Progression Continue to progress workloads to maintain intensity without signs/symptoms of physical distress.      Resistance Training   Training Prescription Yes    Weight 5 lbs    Reps 10-15    Time 10 Minutes      Treadmill   MPH 2.2    Grade 0.5    Minutes 15    METs 2.84      REL-XR   Level 3    Speed 55    Minutes 15    METs 3.8      Oxygen   Maintain Oxygen Saturation 88% or higher             Functional Capacity:  6 Minute Walk     Row Name 03/15/23 0957 05/20/23 0944       6 Minute Walk   Phase Initial Discharge    Distance 1200 feet 1665 feet  initial recalculated to 1330  Distance % Change -- 25.2 %    Distance Feet Change -- 335 ft    Walk Time 6 minutes 6 minutes    # of Rest Breaks 0 0    MPH 2.27 3.15    METS 2.68 4.11    RPE 9 11    VO2 Peak 9.37 14.39    Symptoms No Yes (comment)    Comments -- sciattic pain in buttocks 7/10    Resting HR 57 bpm 56 bpm    Resting BP 104/58 90/50    Resting Oxygen Saturation  97 % --    Exercise Oxygen Saturation  during 6 min walk 96 % --    Max Ex. HR 113 bpm 108 bpm    Max Ex. BP 128/62 124/64    2 Minute Post BP 106/64 --            Nutrition & Weight - Outcomes:  Pre Biometrics - 03/15/23 1005       Pre Biometrics   Height 5' 8.25" (1.734 m)    Weight 80.2 kg    Waist Circumference 36 inches    Hip Circumference 37.5 inches    Waist to Hip Ratio 0.96 %    BMI (Calculated) 26.69    Grip Strength 30.7 kg    Single Leg Stand 10.2 seconds             Post Biometrics - 05/20/23 0945        Post  Biometrics   Height 5' 8.25" (1.734 m)    Weight 77.7 kg    Waist Circumference 35 inches    Hip Circumference 38 inches    Waist to Hip Ratio 0.92 %    BMI (Calculated) 25.86     Grip Strength 35 kg    Single Leg Stand 4.2 seconds

## 2023-05-23 ENCOUNTER — Encounter (HOSPITAL_COMMUNITY): Payer: BC Managed Care – PPO

## 2023-05-23 ENCOUNTER — Encounter (HOSPITAL_COMMUNITY)
Admission: RE | Admit: 2023-05-23 | Discharge: 2023-05-23 | Disposition: A | Discharge status: Home / Self Care | Payer: BC Managed Care – PPO | Source: Ambulatory Visit | Attending: Cardiology | Admitting: Cardiology

## 2023-05-23 DIAGNOSIS — I2111 ST elevation (STEMI) myocardial infarction involving right coronary artery: Secondary | ICD-10-CM

## 2023-05-23 DIAGNOSIS — Z955 Presence of coronary angioplasty implant and graft: Secondary | ICD-10-CM | POA: Diagnosis not present

## 2023-05-23 NOTE — Progress Notes (Signed)
Daily Session Note  Patient Details  Name: Daniel Bautista MRN: [id number] Date of Birth: [birth year] Referring Provider:   Flowsheet Row CARDIAC REHAB PHASE II ORIENTATION from 03/15/2023 in [facility] CARDIAC REHABILITATION  Referring Provider [name] MD       Encounter Date: 05/23/2023  Check In:  Session Check In - 05/23/23 0900       Check-In   Supervising physician immediately available to respond to emergencies See telemetry face sheet for immediately available ER MD    Location [facility]    Staff Present Rodena Medin, RN, BSN;Jessica Juanetta Gosling, MA, RCEP, CCRP, Harolyn Rutherford, RN, BSN    Virtual Visit No    Medication changes reported     No    Fall or balance concerns reported    No    Warm-up and Cool-down Performed on first and last piece of equipment    Resistance Training Performed Yes    VAD Patient? No    PAD/SET Patient? No      Pain Assessment   Currently in Pain? No/denies    Multiple Pain Sites No             Capillary Blood Glucose: No results found for this or any previous visit (from the past 24 hour(s)).    Social History   Tobacco Use  Smoking Status Former   Current packs/day: 0.00   Types: Cigarettes   Start date: 11/01/1977   Quit date: 11/02/2022   Years since quitting: 0.5   Passive exposure: Never  Smokeless Tobacco Never    Goals Met:  Independence with exercise equipment Exercise tolerated well No report of concerns or symptoms today Strength training completed today  Goals Unmet:  Not Applicable  Comments: Pt able to follow exercise prescription today without complaint.  Will continue to monitor for progression.    Dr. [name] is Medical Director for [facility] Cardiac Rehab

## 2023-05-25 ENCOUNTER — Encounter (HOSPITAL_COMMUNITY)
Admission: RE | Admit: 2023-05-25 | Discharge: 2023-05-25 | Disposition: A | Discharge status: Home / Self Care | Payer: BC Managed Care – PPO | Source: Ambulatory Visit | Attending: Cardiology | Admitting: Cardiology

## 2023-05-25 ENCOUNTER — Encounter (HOSPITAL_COMMUNITY): Payer: BC Managed Care – PPO

## 2023-05-25 ENCOUNTER — Encounter (HOSPITAL_COMMUNITY): Payer: Self-pay | Admitting: *Deleted

## 2023-05-25 DIAGNOSIS — I2111 ST elevation (STEMI) myocardial infarction involving right coronary artery: Secondary | ICD-10-CM

## 2023-05-25 DIAGNOSIS — Z955 Presence of coronary angioplasty implant and graft: Secondary | ICD-10-CM | POA: Diagnosis not present

## 2023-05-25 NOTE — Progress Notes (Signed)
Daily Session Note  Patient Details  Name: Daniel Bautista MRN: [id number] Date of Birth: 1960/12/24 Referring Provider:   Flowsheet Row CARDIAC REHAB PHASE II ORIENTATION from 03/15/2023 in [facility] CARDIAC REHABILITATION  Referring Provider [name] MD       Encounter Date: 05/25/2023  Check In:  Session Check In - 05/25/23 0959       Check-In   Supervising physician immediately available to respond to emergencies See telemetry face sheet for immediately available MD    Location [facility]    Staff Present Ross Ludwig, BS, Exercise Physiologist;Anabelen Kaminsky Juanetta Gosling, MA, RCEP, CCRP, Dow Adolph, RN, BSN    Virtual Visit No    Medication changes reported     No    Fall or balance concerns reported    No    Warm-up and Cool-down Performed on first and last piece of equipment    Resistance Training Performed Yes    VAD Patient? No    PAD/SET Patient? No      Pain Assessment   Currently in Pain? No/denies             Capillary Blood Glucose: No results found for this or any previous visit (from the past 24 hour(s)).    Social History   Tobacco Use  Smoking Status Former   Current packs/day: 0.00   Types: Cigarettes   Start date: 11/01/1977   Quit date: 11/02/2022   Years since quitting: 0.5   Passive exposure: Never  Smokeless Tobacco Never    Goals Met:  Independence with exercise equipment Exercise tolerated well No report of concerns or symptoms today Strength training completed today  Goals Unmet:  Not Applicable  Comments: Pt able to follow exercise prescription today without complaint.  Will continue to monitor for progression.    Dr. [name] is Medical Director for [facility] Cardiac Rehab

## 2023-05-25 NOTE — Progress Notes (Signed)
Cardiac Individual Treatment Plan  Patient Details  Name: Daniel Bautista MRN: [id number] Date of Birth: [birth year] Referring Provider:   Flowsheet Row CARDIAC REHAB PHASE II ORIENTATION from 03/15/2023 in [facility] CARDIAC REHABILITATION  Referring Provider [name] MD       Initial Encounter Date:  Flowsheet Row CARDIAC REHAB PHASE II ORIENTATION from 03/15/2023 in [place] CARDIAC REHABILITATION  Date 03/15/23       Visit Diagnosis: ST elevation myocardial infarction involving right coronary artery [facility])  Status post coronary artery stent placement  Patient's Home Medications on Admission:  Current Outpatient Medications:    Accu-Chek Softclix Lancets lancets, Use as directed, Disp: 100 each, Rfl: 0   aspirin EC 81 MG tablet, Take 1 tablet (81 mg total) by mouth daily. Swallow whole., Disp: 90 tablet, Rfl: 3   atorvastatin (LIPITOR) 80 MG tablet, Take 1 tablet (80 mg total) by mouth daily., Disp: 90 tablet, Rfl: 3   blood glucose meter kit and supplies, Dispense based on patient and insurance preference. Use up to four times daily as directed. (FOR ICD-10 E10.9, E11.9)., Disp: 1 each, Rfl: 0   Blood Glucose Monitoring Suppl KIT, 1 Units by Does not apply route daily., Disp: 1 kit, Rfl: 0   dapagliflozin propanediol (FARXIGA) 10 MG TABS tablet, Take 1 tablet (10 mg total) by mouth daily before breakfast., Disp: 30 tablet, Rfl: 11   Evolocumab (REPATHA SURECLICK) 140 MG/ML SOAJ, Inject 140 mg into the skin every 14 (fourteen) days., Disp: 2 mL, Rfl: 11   glucose blood (ONETOUCH VERIO) test strip, Use as instructed, Disp: 100 each, Rfl: 12   Insulin Pen Needle 32G X 4 MM MISC, Use daily, Disp: 100 each, Rfl: 3   metFORMIN (GLUCOPHAGE) 500 MG tablet, Take 1 tablet (500 mg total) by mouth daily with breakfast., Disp: 30 tablet, Rfl: 11   metoprolol succinate (TOPROL XL) 50 MG 24 hr tablet, Take 1 tablet (50 mg total) by mouth daily. Take with or immediately following a  meal., Disp: 90 tablet, Rfl: 3   nitroGLYCERIN (NITROSTAT) 0.4 MG SL tablet, Place 1 tablet (0.4 mg total) under the tongue every 5 (five) minutes x 3 doses as needed for chest pain., Disp: 25 tablet, Rfl: 0   sacubitril-valsartan (ENTRESTO) 49-51 MG, Take 1 tablet by mouth 2 (two) times daily., Disp: 180 tablet, Rfl: 3   spironolactone (ALDACTONE) 25 MG tablet, Take 1 tablet (25 mg total) by mouth daily., Disp: 90 tablet, Rfl: 3   ticagrelor (BRILINTA) 90 MG TABS tablet, Take 1 tablet (90 mg total) by mouth 2 (two) times daily., Disp: 180 tablet, Rfl: 1   tirzepatide (MOUNJARO) 7.5 MG/0.5ML Pen, Inject 7.5 mg into the skin once a week., Disp: 2 mL, Rfl: 0  Past Medical History: Past Medical History:  Diagnosis Date   Acute ST elevation myocardial infarction (STEMI) of inferior wall (HCC) 11/02/2022   Coronary artery disease involving native coronary artery of native heart with unstable angina pectoris (HCC) 11/03/2022   04/04/2023     Culprit lesion: Heavily thrombotic ulcerated /calcified prox-mid RCA 100% occlusion around crux after an RVM branch.  (After angioplasty and stenting there is a long area of the distal RCA into the PDA which is about 65% stenosed => improved some w/ NTG IC, so plan MedRx; OM1 90%, D1 80% (Med Rx).   Current smoker 11/03/2022   Hyperlipidemia associated with type 2 diabetes mellitus (HCC) 11/03/2022   Ischemic cardiomyopathy 11/03/2022   TTE 11/03/2022-s/p inferior STEMI:  EF 35 to 40%.  Moderate reduced function.  Global HK.  GR 2 DD.  D-shaped IVS consistent with RV volume overload.  RV function mildly reduced, mildly enlarged.   Presence of drug coated stent in right coronary artery 11/03/2022   Overlapping Synergr DES 2.5 x 48 & 2.75 x 12 - tapered post-dilation 3.1-2.8-2.6 mm (prox-~distal RCA) in setting of Inf STEMI -100% prox RCA   Type 2 diabetes mellitus with complication, without long-term current use of insulin (HCC) 11/03/2022    Tobacco Use: Social  History   Tobacco Use  Smoking Status Former   Current packs/day: 0.00   Types: Cigarettes   Start date: 11/01/1977   Quit date: 11/02/2022   Years since quitting: 0.5   Passive exposure: Never  Smokeless Tobacco Never    Labs: Review Flowsheet       Latest Ref Rng & Units 11/02/2022 11/03/2022 01/04/2023  Labs for ITP Cardiac and Pulmonary Rehab  Cholestrol 0 - 200 mg/dL 629  528  413   LDL (calc) 0 - 99 mg/dL 244  010  73   HDL-C >27 mg/dL 41  39  33   Trlycerides <150 mg/dL 253  664  403   Hemoglobin A1c 4.8 - 5.6 % 12.2  11.7  -    Details            Capillary Blood Glucose: Lab Results  Component Value Date   GLUCAP 163 (H) 04/01/2023   GLUCAP 216 (H) 04/01/2023   GLUCAP 134 (H) 03/23/2023   GLUCAP 187 (H) 03/23/2023   GLUCAP 221 (H) 03/21/2023     Exercise Target Goals: Exercise Program Goal: Individual exercise prescription set using results from initial 6 min walk test and THRR while considering  patient's activity barriers and safety.   Exercise Prescription Goal: Starting with aerobic activity 30 plus minutes a day, 3 days per week for initial exercise prescription. Provide home exercise prescription and guidelines that participant acknowledges understanding prior to discharge.  Activity Barriers & Risk Stratification:  Activity Barriers & Cardiac Risk Stratification - 03/14/23 1103       Activity Barriers & Cardiac Risk Stratification   Activity Barriers Deconditioning;Muscular Weakness;Back Problems   occassional back pain   Cardiac Risk Stratification Moderate             6 Minute Walk:  6 Minute Walk     Row Name 03/15/23 0957 05/20/23 0944       6 Minute Walk   Phase Initial Discharge    Distance 1200 feet 1665 feet  initial recalculated to 1330    Distance % Change -- 25.2 %    Distance Feet Change -- 335 ft    Walk Time 6 minutes 6 minutes    # of Rest Breaks 0 0    MPH 2.27 3.15    METS 2.68 4.11    RPE 9 11    VO2 Peak 9.37  14.39    Symptoms No Yes (comment)    Comments -- sciattic pain in buttocks 7/10    Resting HR 57 bpm 56 bpm    Resting BP 104/58 90/50    Resting Oxygen Saturation  97 % --    Exercise Oxygen Saturation  during 6 min walk 96 % --    Max Ex. HR 113 bpm 108 bpm    Max Ex. BP 128/62 124/64    2 Minute Post BP 106/64 --  Oxygen Initial Assessment:   Oxygen Re-Evaluation:   Oxygen Discharge (Final Oxygen Re-Evaluation):   Initial Exercise Prescription:  Initial Exercise Prescription - 03/15/23 1000       Date of Initial Exercise RX and Referring Provider   Date 03/15/23    Referring Provider [name] MD      Oxygen   Maintain Oxygen Saturation 88% or higher      Treadmill   MPH 2.2    Grade 0.5    Minutes 15    METs 2.84      REL-XR   Level 2    Speed 50    Minutes 15    METs 2.5      Prescription Details   Frequency (times per week) 3    Duration Progress to 30 minutes of continuous aerobic without signs/symptoms of physical distress      Intensity   THRR 40-80% of Max Heartrate 97-138    Ratings of Perceived Exertion 11-13    Perceived Dyspnea 0-4      Progression   Progression Continue to progress workloads to maintain intensity without signs/symptoms of physical distress.      Resistance Training   Training Prescription Yes    Weight 4 lb    Reps 10-15             Perform Capillary Blood Glucose checks as needed.  Exercise Prescription Changes:   Exercise Prescription Changes     Row Name 03/15/23 1000 04/01/23 1300 04/25/23 1300 05/09/23 1300       Response to Exercise   Blood Pressure (Admit) 104/58 92/60 90/60  106/64    Blood Pressure (Exercise) 128/62 100/52 -- --    Blood Pressure (Exit) 106/64 98/48 96/60  98/90    Heart Rate (Admit) 57 bpm 63 bpm 57 bpm 58 bpm    Heart Rate (Exercise) 113 bpm 105 bpm 93 bpm 96 bpm    Heart Rate (Exit) 60 bpm 66 bpm 64 bpm 68 bpm    Oxygen Saturation (Admit) 97 % -- -- --     Oxygen Saturation (Exercise) 96 % -- -- --    Rating of Perceived Exertion (Exercise) 9 11 12 12     Symptoms none -- -- --    Comments walk test results -- -- --    Duration -- Continue with 30 min of aerobic exercise without signs/symptoms of physical distress. Continue with 30 min of aerobic exercise without signs/symptoms of physical distress. Continue with 30 min of aerobic exercise without signs/symptoms of physical distress.    Intensity -- THRR unchanged THRR unchanged THRR unchanged      Progression   Progression -- Continue to progress workloads to maintain intensity without signs/symptoms of physical distress. Continue to progress workloads to maintain intensity without signs/symptoms of physical distress. Continue to progress workloads to maintain intensity without signs/symptoms of physical distress.      Resistance Training   Training Prescription -- Yes Yes Yes    Weight -- 5 5 5  lbs    Reps -- 10-15 10-15 10-15    Time -- -- -- 10 Minutes      Treadmill   MPH -- 2.2 2.2 2.2    Grade -- 0 0.5 0.5    Minutes -- 15 15 15     METs -- 2.69 2.84 2.84      REL-XR   Level -- 2 3 3     Speed -- 56 56 55    Minutes -- 15 15 15  METs -- 3.4 3.2 3.8      Oxygen   Maintain Oxygen Saturation -- -- -- 88% or higher             Exercise Comments:   Exercise Comments     Row Name 03/21/23 1116           Exercise Comments First full day of exercise!  Patient was oriented to gym and equipment including functions, settings, policies, and procedures.  Patient's individual exercise prescription and treatment plan were reviewed.  All starting workloads were established based on the results of the 6 minute walk test done at initial orientation visit.  The plan for exercise progression was also introduced and progression will be customized based on patient's performance and goals.                Exercise Goals and Review:   Exercise Goals     Row Name 03/15/23 1004              Exercise Goals   Increase Physical Activity Yes       Intervention Provide advice, education, support and counseling about physical activity/exercise needs.;Develop an individualized exercise prescription for aerobic and resistive training based on initial evaluation findings, risk stratification, comorbidities and participant's personal goals.       Expected Outcomes Short Term: Attend rehab on a regular basis to increase amount of physical activity.;Long Term: Add in home exercise to make exercise part of routine and to increase amount of physical activity.;Long Term: Exercising regularly at least 3-5 days a week.       Increase Strength and Stamina Yes       Intervention Provide advice, education, support and counseling about physical activity/exercise needs.;Develop an individualized exercise prescription for aerobic and resistive training based on initial evaluation findings, risk stratification, comorbidities and participant's personal goals.       Expected Outcomes Short Term: Increase workloads from initial exercise prescription for resistance, speed, and METs.;Short Term: Perform resistance training exercises routinely during rehab and add in resistance training at home;Long Term: Improve cardiorespiratory fitness, muscular endurance and strength as measured by increased METs and functional capacity ( )       Able to understand and use rate of perceived exertion (RPE) scale Yes       Intervention Provide education and explanation on how to use RPE scale       Expected Outcomes Short Term: Able to use RPE daily in rehab to express subjective intensity level;Long Term:  Able to use RPE to guide intensity level when exercising independently       Able to understand and use Dyspnea scale Yes       Intervention Provide education and explanation on how to use Dyspnea scale       Expected Outcomes Short Term: Able to use Dyspnea scale daily in rehab to express subjective sense of  shortness of breath during exertion;Long Term: Able to use Dyspnea scale to guide intensity level when exercising independently       Knowledge and understanding of Target Heart Rate Range (THRR) Yes       Intervention Provide education and explanation of THRR including how the numbers were predicted and where they are located for reference       Expected Outcomes Long Term: Able to use THRR to govern intensity when exercising independently;Short Term: Able to state/look up THRR;Short Term: Able to use daily as guideline for intensity in rehab       Able  to check pulse independently Yes       Intervention Provide education and demonstration on how to check pulse in carotid and radial arteries.;Review the importance of being able to check your own pulse for safety during independent exercise       Expected Outcomes Short Term: Able to explain why pulse checking is important during independent exercise;Long Term: Able to check pulse independently and accurately       Understanding of Exercise Prescription Yes       Intervention Provide education, explanation, and written materials on patient's individual exercise prescription       Expected Outcomes Short Term: Able to explain program exercise prescription;Long Term: Able to explain home exercise prescription to exercise independently                Exercise Goals Re-Evaluation :  Exercise Goals Re-Evaluation     Row Name 03/15/23 1005 04/01/23 1322 04/13/23 0923 04/27/23 1320 05/02/23 0932     Exercise Goal Re-Evaluation   Exercise Goals Review Able to understand and use rate of perceived exertion (RPE) scale;Able to understand and use Dyspnea scale;Understanding of Exercise Prescription Increase Physical Activity;Understanding of Exercise Prescription;Increase Strength and Stamina Increase Strength and Stamina;Understanding of Exercise Prescription;Increase Physical Activity Increase Physical Activity;Able to understand and use rate of  perceived exertion (RPE) scale;Increase Strength and Stamina;Understanding of Exercise Prescription Increase Physical Activity;Increase Strength and Stamina;Able to understand and use rate of perceived exertion (RPE) scale;Able to understand and use Dyspnea scale;Knowledge and understanding of Target Heart Rate Range (THRR);Able to check pulse independently   Comments Reviewed RPE  and dyspnea scale, and program prescription with pt today.  Pt voiced understanding and was given a copy of goals to take home. Soyla Murphy has not increased his workload or levels on his equipment in the last two weeks. He is exercising at level 2 on the XR and 2.2 speed on the treadmill. Will continue to monitor as able. Montravious has continued to increase his workload/levels on his equipment.  He is exercising at 2.2 speed and 0.5 incline on the treadmill and is going to try level 3 today on the ellpictical.  We will continue to monitor his progress. Sylvan is doing well in rehab. He has increased his workload on the elliptical ro level 3. He has not increased his speed or grade on the treadmill. He continue to exercise at an RPE of 12 and 3.2 METS. Will continue to monitor and progress as able. Donaldo is doing well in rehab.  He is walking some on his off days.  Reviewed home exercise with pt today.  Pt plans to walk at home for exercise.  Reviewed THR, pulse, RPE, sign and symptoms, pulse oximetery and when to call 911 or MD.  Also discussed weather considerations and indoor options.  Pt voiced understanding.   Expected Outcomes Short: Use RPE daily to regulate intensity.  Long: Follow program prescription Short term: Increase level on the NuStep to level 3 in the next week   long term: continue to attend cardiac rehab Short term:  Go over home exercise program  Long term:  Continue to increase his workload/levels while in rehab Short term: increase speed on treadmill in the next two weeks  long term: continue to attend rehab Short: Start  to add in more walking at home Long: Conitnue to improve stamina    Row Name 05/09/23 1342             Exercise Goal Re-Evaluation  Exercise Goals Review Increase Physical Activity;Increase Strength and Stamina;Understanding of Exercise Prescription       Comments Adriaan is tolerating exercise well. He has nit increased his level or speed on the treadmill in the past two weeks. His RPM has increased on the XR slightly. Will continue to monitor and progress as able.       Expected Outcomes Short term: Increase his level on the XR in the next week   long term: continue to attend rehab                 Discharge Exercise Prescription (Final Exercise Prescription Changes):  Exercise Prescription Changes - 05/09/23 1300       Response to Exercise   Blood Pressure (Admit) 106/64    Blood Pressure (Exit) 98/90    Heart Rate (Admit) 58 bpm    Heart Rate (Exercise) 96 bpm    Heart Rate (Exit) 68 bpm    Rating of Perceived Exertion (Exercise) 12    Duration Continue with 30 min of aerobic exercise without signs/symptoms of physical distress.    Intensity THRR unchanged      Progression   Progression Continue to progress workloads to maintain intensity without signs/symptoms of physical distress.      Resistance Training   Training Prescription Yes    Weight 5 lbs    Reps 10-15    Time 10 Minutes      Treadmill   MPH 2.2    Grade 0.5    Minutes 15    METs 2.84      REL-XR   Level 3    Speed 55    Minutes 15    METs 3.8      Oxygen   Maintain Oxygen Saturation 88% or higher             Nutrition:  Target Goals: Understanding of nutrition guidelines, daily intake of sodium 1500mg , cholesterol 200mg , calories 30% from fat and 7% or less from saturated fats, daily to have 5 or more servings of fruits and vegetables.  Biometrics:  Pre Biometrics - 03/15/23 1005       Pre Biometrics   Height 5' 8.25" (1.734 m)    Weight 176 lb 14.4 oz (80.2 kg)    Waist  Circumference 36 inches    Hip Circumference 37.5 inches    Waist to Hip Ratio 0.96 %    BMI (Calculated) 26.69    Grip Strength 30.7 kg    Single Leg Stand 10.2 seconds             Post Biometrics - 05/20/23 0945        Post  Biometrics   Height 5' 8.25" (1.734 m)    Weight 171 lb 6.4 oz (77.7 kg)    Waist Circumference 35 inches    Hip Circumference 38 inches    Waist to Hip Ratio 0.92 %    BMI (Calculated) 25.86    Grip Strength 35 kg    Single Leg Stand 4.2 seconds             Nutrition Therapy Plan and Nutrition Goals:  Nutrition Therapy & Goals - 03/14/23 1119       Intervention Plan   Intervention Prescribe, educate and counsel regarding individualized specific dietary modifications aiming towards targeted core components such as weight, hypertension, lipid management, diabetes, heart failure and other comorbidities.;Nutrition handout(s) given to patient.    Expected Outcomes Short Term Goal: Understand basic principles of dietary  content, such as calories, fat, sodium, cholesterol and nutrients.             Nutrition Assessments:  MEDIFICTS Score Key: >=70 Need to make dietary changes  40-70 Heart Healthy Diet <= 40 Therapeutic Level Cholesterol Diet  Flowsheet Row CARDIAC REHAB PHASE II ORIENTATION from 03/15/2023 in [facility] CARDIAC REHABILITATION  Picture Your Plate Total Score on Admission 48      Picture Your Plate Scores: <16 Unhealthy dietary pattern with much room for improvement. 41-50 Dietary pattern unlikely to meet recommendations for good health and room for improvement. 51-60 More healthful dietary pattern, with some room for improvement.  >60 Healthy dietary pattern, although there may be some specific behaviors that could be improved.    Nutrition Goals Re-Evaluation:  Nutrition Goals Re-Evaluation     Row Name 04/13/23 0933 05/02/23 0938           Goals   Nutrition Goal Heart healthy diet Short term: Add more fruit  into his diet Long term: Maintain a balance of fruit/vegetables every day in his diet      Comment Pt is currently trying to stay away from sweets and is also limiting his intake of red meat.  He eats a lot of grilled/baked chicken, but he states that he does not eat a lot of vegetables or fruits.  Pt states he has lost a few pounds since starting the program, and he has recently started on Mounjaro.  He currently checks his blood sugar every day. Ellwyn is doing well in rehab. He feels like he is doing well with his diet.  He has not tried to add any more fruit in yet.  He says that things don't taste good.  He was encouraged to keep trying new foods.  He is trying to balance meals with protein and sticking to lean proteins. He has not had much appetite so portions are doing well. He has not talked to doctor about it yet, but will bring it up at appt next month.      Expected Outcome Short term: Add more fruit into his diet                  Long term:  Maintain a balance of fruit/vegetables every day in his diet Short: Keep balancing meals with protein Long: Conitnue to eat to maintain               Nutrition Goals Discharge (Final Nutrition Goals Re-Evaluation):  Nutrition Goals Re-Evaluation - 05/02/23 0938       Goals   Nutrition Goal Short term: Add more fruit into his diet Long term: Maintain a balance of fruit/vegetables every day in his diet    Comment Benjerman is doing well in rehab. He feels like he is doing well with his diet.  He has not tried to add any more fruit in yet.  He says that things don't taste good.  He was encouraged to keep trying new foods.  He is trying to balance meals with protein and sticking to lean proteins. He has not had much appetite so portions are doing well. He has not talked to doctor about it yet, but will bring it up at appt next month.    Expected Outcome Short: Keep balancing meals with protein Long: Conitnue to eat to maintain              Psychosocial: Target Goals: Acknowledge presence or absence of significant depression and/or stress, maximize coping  skills, provide positive support system. Participant is able to verbalize types and ability to use techniques and skills needed for reducing stress and depression.  Initial Review & Psychosocial Screening:  Initial Psych Review & Screening - 03/14/23 1105       Initial Review   Current issues with Current Stress Concerns    Source of Stress Concerns Financial;Unable to participate in former interests or hobbies;Occupation;Retirement/disability    Comments Just retired from work on 7/25, quit smoking in March, takes his time, gets about 6 hr a night for sleep      Family Dynamics   Good Support System? Yes   wife     Barriers   Psychosocial barriers to participate in program The patient should benefit from training in stress management and relaxation.;Psychosocial barriers identified (see note)      Screening Interventions   Interventions Encouraged to exercise;Provide feedback about the scores to participant;To provide support and resources with identified psychosocial needs    Expected Outcomes Short Term goal: Utilizing psychosocial counselor, staff and physician to assist with identification of specific Stressors or current issues interfering with healing process. Setting desired goal for each stressor or current issue identified.;Long Term Goal: Stressors or current issues are controlled or eliminated.;Short Term goal: Identification and review with participant of any Quality of Life or Depression concerns found by scoring the questionnaire.;Long Term goal: The participant improves quality of Life and PHQ9 Scores as seen by post scores and/or verbalization of changes             Quality of Life Scores:  Quality of Life - 03/15/23 1006       Quality of Life   Select Quality of Life      Quality of Life Scores   Health/Function Pre 24.8 %    Socioeconomic  Pre 27.43 %    Psych/Spiritual Pre 30 %    Family Pre 30 %    GLOBAL Pre 27.18 %            Scores of 19 and below usually indicate a poorer quality of life in these areas.  A difference of  2-3 points is a clinically meaningful difference.  A difference of 2-3 points in the total score of the Quality of Life Index has been associated with significant improvement in overall quality of life, self-image, physical symptoms, and general health in studies assessing change in quality of life.  PHQ-9: Review Flowsheet       03/15/2023  Depression screen PHQ 2/9  Decreased Interest 0  Down, Depressed, Hopeless 0  PHQ - 2 Score 0  Altered sleeping 0  Tired, decreased energy 1  Change in appetite 2  Feeling bad or failure about yourself  0  Trouble concentrating 0  Moving slowly or fidgety/restless 0  Suicidal thoughts 0  PHQ-9 Score 3  Difficult doing work/chores Not difficult at all    Details           Interpretation of Total Score  Total Score Depression Severity:  1-4 = Minimal depression, 5-9 = Mild depression, 10-14 = Moderate depression, 15-19 = Moderately severe depression, 20-27 = Severe depression   Psychosocial Evaluation and Intervention:  Psychosocial Evaluation - 03/14/23 1111       Psychosocial Evaluation & Interventions   Interventions Encouraged to exercise with the program and follow exercise prescription;Stress management education    Comments Takuma is coming into cardiac rehab 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  He is doing well  from a cardiac standpoint.  He has recently quit smoking after his heart attack and has not had any cravings.  He is feeling good about his accomplishment and has been praised by all providers.  He offically retired last week to reduce stress in his life and is feeling pretty good about it.  He tries not to let too much get to him.  It is just him and his wife who is a great supporter for him.  He sleeps well most nights.  He  enjoys getting out to fish, but has not been able to do much with the heat this summer. He is not sure what to expect from program but would like to build stamina and feel a bit more confident after his heart event.    Expected Outcomes Short: Attend rehab to boost stamina Long: Build confidence to live life without worry about heart    Continue Psychosocial Services  Follow up required by staff             Psychosocial Re-Evaluation:  Psychosocial Re-Evaluation     Row Name 04/13/23 0929 05/02/23 0936           Psychosocial Re-Evaluation   Current issues with Current Sleep Concerns Current Sleep Concerns;Current Stress Concerns      Comments Pt has a hx of only getting 6 hours of sleep due to having to get up and use the bathroom about every 2 hours.  He currently does not take any sleep medications and states that he does not take any medication for overactive bladder. Randee is doing well in rehab.  He is feeling good mentally.  His biggest stressor right now is his mom just passed and he has her funeral to attend and things to wrap up. He is still not sleeping the best with trips to bathroom.  He is usually able to get back to sleep afterwards.  He is usually up until 1130 at night.      Expected Outcomes Pt will continue to have no identifiable identifiable psychosocial barriers and will discuss his bladder concerns with his primary care doctor as his next visit. Short: Cope with losing his mom Long; Continue to work on sleep      Interventions Stress management education;Relaxation education;Encouraged to attend Cardiac Rehabilitation for the exercise Encouraged to attend Cardiac Rehabilitation for the exercise;Stress management education      Continue Psychosocial Services  Follow up required by staff Follow up required by staff        Initial Review   Source of Stress Concerns -- Family               Psychosocial Discharge (Final Psychosocial Re-Evaluation):  Psychosocial  Re-Evaluation - 05/02/23 0936       Psychosocial Re-Evaluation   Current issues with Current Sleep Concerns;Current Stress Concerns    Comments Adib is doing well in rehab.  He is feeling good mentally.  His biggest stressor right now is his mom just passed and he has her funeral to attend and things to wrap up. He is still not sleeping the best with trips to bathroom.  He is usually able to get back to sleep afterwards.  He is usually up until 1130 at night.    Expected Outcomes Short: Cope with losing his mom Long; Continue to work on sleep    Interventions Encouraged to attend Cardiac Rehabilitation for the exercise;Stress management education    Continue Psychosocial Services  Follow up required by staff  Initial Review   Source of Stress Concerns Family             Vocational Rehabilitation: Provide vocational rehab assistance to qualifying candidates.   Vocational Rehab Evaluation & Intervention:  Vocational Rehab - 03/14/23 1104       Initial Vocational Rehab Evaluation & Intervention   Assessment shows need for Vocational Rehabilitation No   just retired Thursday  03/10/23            Education: Education Goals: Education classes will be provided on a weekly basis, covering required topics. Participant will state understanding/return demonstration of topics presented.  Learning Barriers/Preferences:  Learning Barriers/Preferences - 03/14/23 1102       Learning Barriers/Preferences   Learning Barriers Sight   glasses   Learning Preferences None             Education Topics: Hypertension, Hypertension Reduction -Define heart disease and high blood pressure. Discus how high blood pressure affects the body and ways to reduce high blood pressure.   Exercise and Your Heart -Discuss why it is important to exercise, the FITT principles of exercise, normal and abnormal responses to exercise, and how to exercise safely. Flowsheet Row CARDIAC REHAB PHASE II  EXERCISE from 05/18/2023 in [place] CARDIAC REHABILITATION  Date 05/11/23  Educator [facility]  Instruction Review Code 1- Verbalizes Understanding       Angina -Discuss definition of angina, causes of angina, treatment of angina, and how to decrease risk of having angina. Flowsheet Row CARDIAC REHAB PHASE II EXERCISE from 05/18/2023 in [place] CARDIAC REHABILITATION  Date 05/04/23  Educator [facility]  Instruction Review Code 1- Verbalizes Understanding       Cardiac Medications -Review what the following cardiac medications are used for, how they affect the body, and side effects that may occur when taking the medications.  Medications include Aspirin, Beta blockers, calcium channel blockers, ACE Inhibitors, angiotensin receptor blockers, diuretics, digoxin, and antihyperlipidemics. Flowsheet Row CARDIAC REHAB PHASE II EXERCISE from 05/18/2023 in [place] CARDIAC REHABILITATION  Date 04/13/23  Educator DJ  Instruction Review Code 1- Verbalizes Understanding       Congestive Heart Failure -Discuss the definition of CHF, how to live with CHF, the signs and symptoms of CHF, and how keep track of weight and sodium intake. Flowsheet Row CARDIAC REHAB PHASE II EXERCISE from 05/18/2023 in [place] CARDIAC REHABILITATION  Date 04/27/23  Educator HB  Instruction Review Code 1- Verbalizes Understanding       Heart Disease and Intimacy -Discus the effect sexual activity has on the heart, how changes occur during intimacy as we age, and safety during sexual activity. Flowsheet Row CARDIAC REHAB PHASE II EXERCISE from 05/18/2023 in [place] CARDIAC REHABILITATION  Date 03/23/23  Educator [facility]  Instruction Review Code 1- Verbalizes Understanding       Smoking Cessation / COPD -Discuss different methods to quit smoking, the health benefits of quitting smoking, and the definition of COPD. Flowsheet Row CARDIAC REHAB PHASE II EXERCISE from 05/18/2023 in [place] CARDIAC REHABILITATION  Date  04/20/23  Educator [facility]  Instruction Review Code 2- Demonstrated Understanding       Nutrition I: Fats -Discuss the types of cholesterol, what cholesterol does to the heart, and how cholesterol levels can be controlled.   Nutrition II: Labels -Discuss the different components of food labels and how to read food label   Heart Parts/Heart Disease and PAD -Discuss the anatomy of the heart, the pathway of blood circulation through the  heart, and these are affected by heart disease.   Stress I: Signs and Symptoms -Discuss the causes of stress, how stress may lead to anxiety and depression, and ways to limit stress. Flowsheet Row CARDIAC REHAB PHASE II EXERCISE from 05/18/2023 in [place] CARDIAC REHABILITATION  Date 04/06/23  Educator [facility]  Instruction Review Code 1- Verbalizes Understanding       Stress II: Relaxation -Discuss different types of relaxation techniques to limit stress. Flowsheet Row CARDIAC REHAB PHASE II EXERCISE from 05/18/2023 in [place] CARDIAC REHABILITATION  Date 04/06/23  Educator [facility]  Instruction Review Code 1- Verbalizes Understanding       Warning Signs of Stroke / TIA -Discuss definition of a stroke, what the signs and symptoms are of a stroke, and how to identify when someone is having stroke.   Knowledge Questionnaire Score:  Knowledge Questionnaire Score - 03/15/23 1007       Knowledge Questionnaire Score   Pre Score 20/28             Core Components/Risk Factors/Patient Goals at Admission:  Personal Goals and Risk Factors at Admission - 03/15/23 1007       Core Components/Risk Factors/Patient Goals on Admission    Weight Management Yes;Weight Loss    Intervention Weight Management: Develop a combined nutrition and exercise program designed to reach desired caloric intake, while maintaining appropriate intake of nutrient and fiber, sodium and fats, and appropriate energy expenditure required for the weight goal.;Weight Management:  Provide education and appropriate resources to help participant work on and attain dietary goals.;Weight Management/Obesity: Establish reasonable short term and long term weight goals.    Admit Weight 176 lb 14.4 oz (80.2 kg)    Goal Weight: Short Term 171 lb (77.6 kg)    Goal Weight: Long Term 165 lb (74.8 kg)    Expected Outcomes Short Term: Continue to assess and modify interventions until short term weight is achieved;Long Term: Adherence to nutrition and physical activity/exercise program aimed toward attainment of established weight goal;Weight Loss: Understanding of general recommendations for a balanced deficit meal plan, which promotes 1-2 lb weight loss per week and includes a negative energy balance of [phone] kcal/d;Understanding recommendations for meals to include 15-35% energy as protein, 25-35% energy from fat, 35-60% energy from carbohydrates, less than 200mg  of dietary cholesterol, 20-35 gm of total fiber daily;Understanding of distribution of calorie intake throughout the day with the consumption of 4-5 meals/snacks    Tobacco Cessation Yes   quit 11/02/22   Intervention Assist the participant in steps to quit. Provide individualized education and counseling about committing to Tobacco Cessation, relapse prevention, and pharmacological support that can be provided by physician.    Expected Outcomes Long Term: Complete abstinence from all tobacco products for at least 12 months from quit date.;Short Term: Will quit all tobacco product use, adhering to prevention of relapse plan.    Diabetes Yes    Intervention Provide education about signs/symptoms and action to take for hypo/hyperglycemia.;Provide education about proper nutrition, including hydration, and aerobic/resistive exercise prescription along with prescribed medications to achieve blood glucose in normal ranges: Fasting glucose 65-99 mg/dL    Expected Outcomes Short Term: Participant verbalizes understanding of the signs/symptoms  and immediate care of hyper/hypoglycemia, proper foot care and importance of medication, aerobic/resistive exercise and nutrition plan for blood glucose control.;Long Term: Attainment of HbA1C < 7%.    Heart Failure Yes    Intervention Provide a combined exercise and nutrition program that is supplemented with education, support  and counseling about heart failure. Directed toward relieving symptoms such as shortness of breath, decreased exercise tolerance, and extremity edema.    Expected Outcomes Improve functional capacity of life;Short term: Attendance in program 2-3 days a week with increased exercise capacity. Reported lower sodium intake. Reported increased fruit and vegetable intake. Reports medication compliance.;Short term: Daily weights obtained and reported for increase. Utilizing diuretic protocols set by physician.;Long term: Adoption of self-care skills and reduction of barriers for early signs and symptoms recognition and intervention leading to self-care maintenance.    Hypertension Yes    Intervention Provide education on lifestyle modifcations including regular physical activity/exercise, weight management, moderate sodium restriction and increased consumption of fresh fruit, vegetables, and low fat dairy, alcohol moderation, and smoking cessation.;Monitor prescription use compliance.    Expected Outcomes Short Term: Continued assessment and intervention until BP is < 140/64mm HG in hypertensive participants. < 130/60mm HG in hypertensive participants with diabetes, heart failure or chronic kidney disease.;Long Term: Maintenance of blood pressure at goal levels.    Lipids Yes    Intervention Provide education and support for participant on nutrition & aerobic/resistive exercise along with prescribed medications to achieve LDL 70mg , HDL >40mg .    Expected Outcomes Short Term: Participant states understanding of desired cholesterol values and is compliant with medications prescribed.  Participant is following exercise prescription and nutrition guidelines.;Long Term: Cholesterol controlled with medications as prescribed, with individualized exercise RX and with personalized nutrition plan. Value goals: LDL < 70mg , HDL > 40 mg.             Core Components/Risk Factors/Patient Goals Review:   Goals and Risk Factor Review     Row Name 03/14/23 1121 04/13/23 0944 05/02/23 0941         Core Components/Risk Factors/Patient Goals Review   Personal Goals Review Tobacco Cessation Weight Management/Obesity;Heart Failure;Diabetes;Hypertension;Tobacco Cessation;Lipids Weight Management/Obesity;Heart Failure;Diabetes;Hypertension;Tobacco Cessation;Lipids     Review Helmut has recently quit tobacco use within the last 6 months. He quit at time of heart event.  Intervention for relapse prevention was provided at the initial medical review. He was encouraged to continue to with tobacco cessation and was provided information on relapse prevention. Patient received information about combination therapy, tobacco cessation classes, quit line, and quit smoking apps in case of a relapse. Patient demonstrated understanding of this material.Staff will continue to provide encouragement and follow up with the patient throughout the program. Jayln quit smoking in March of 2024.  He currently weighs himself everyday and knows to look for swelling/sudden weight gain.  He checks his blood sugar everyday and was recently started on Mounjaro to also assist with weight loss.  The pt stated that he has lost a few pounds since starting cardiac rehab.  He is taking Lipitor for HLD and is taking this medication as prescribed with no reported side effects.  The pt also takes his Metoprolol and Spironolactone as prescribed with no reported side effects. Vidur is doing well in rehab.  His weight is holding steady.  He has not had any heart failure symptoms.  He has continued cessation and was commended for his  success!!  His sugars are doing well and staying in the middle range.  He has not had any lows.  His pressures are doing well.  His biggest complaint is that nothing taste good and he does not have an appetitie.  He was encouraged to talk to his doctor about it.     Expected Outcomes Continued cessation Short term:  continue  smoking cessation   Long term:  Get an electronic BP cuff to monitor his blood pressure at home daily Short: Talk to doctor about appetite Long: Conitnue to monitor risk factors.              Core Components/Risk Factors/Patient Goals at Discharge (Final Review):   Goals and Risk Factor Review - 05/02/23 0941       Core Components/Risk Factors/Patient Goals Review   Personal Goals Review Weight Management/Obesity;Heart Failure;Diabetes;Hypertension;Tobacco Cessation;Lipids    Review Murvin is doing well in rehab.  His weight is holding steady.  He has not had any heart failure symptoms.  He has continued cessation and was commended for his success!!  His sugars are doing well and staying in the middle range.  He has not had any lows.  His pressures are doing well.  His biggest complaint is that nothing taste good and he does not have an appetitie.  He was encouraged to talk to his doctor about it.    Expected Outcomes Short: Talk to doctor about appetite Long: Conitnue to monitor risk factors.             ITP Comments:  ITP Comments     Row Name 03/14/23 1120 03/15/23 0956 03/21/23 1116 03/30/23 0809 04/27/23 0804   ITP Comments Completed virtual orientation today.  EP evaluation is scheduled for 03/15/23 at 830.  Documentation for diagnosis can be found in [facility] encounter 11/02/22. Patient attend orientation today.  Patient is attendingCardiac Rehabilitation Program.  Documentation for diagnosis can be found in [facility] encounter 11/02/22.  Reviewed medical chart, RPE/RPD, gym safety, and program guidelines.  Patient was fitted to equipment they will be using during rehab.   Patient is scheduled to start exercise on 03/21/23 at 930.   Initial ITP created and sent for review and signature by Dr. [name], Medical Director for Cardiac Rehabilitation Program. First full day of exercise!  Patient was oriented to gym and equipment including functions, settings, policies, and procedures.  Patient's individual exercise prescription and treatment plan were reviewed.  All starting workloads were established based on the results of the 6 minute walk test done at initial orientation visit.  The plan for exercise progression was also introduced and progression will be customized based on patient's performance and goals. 30 day review completed. ITP sent to Dr. [name], Medical Director of Cardiac Rehab. Continue with ITP unless changes are made by physician. 30 day review completed. ITP sent to Dr. [name], Medical Director of Cardiac Rehab. Continue with ITP unless changes are made by physician.    Row Name 05/25/23 0706           ITP Comments 30 day review completed. ITP sent to Dr. [name], Medical Director of Cardiac Rehab. Continue with ITP unless changes are made by physician.                Comments: 30 day review

## 2023-05-27 ENCOUNTER — Encounter (HOSPITAL_COMMUNITY): Payer: BC Managed Care – PPO

## 2023-05-27 ENCOUNTER — Encounter (HOSPITAL_COMMUNITY)
Admission: RE | Admit: 2023-05-27 | Discharge: 2023-05-27 | Disposition: A | Discharge status: Home / Self Care | Payer: BC Managed Care – PPO | Source: Ambulatory Visit | Attending: Cardiology | Admitting: Cardiology

## 2023-05-27 DIAGNOSIS — I2111 ST elevation (STEMI) myocardial infarction involving right coronary artery: Secondary | ICD-10-CM

## 2023-05-27 DIAGNOSIS — Z955 Presence of coronary angioplasty implant and graft: Secondary | ICD-10-CM | POA: Diagnosis not present

## 2023-05-27 NOTE — Progress Notes (Signed)
Daily Session Note  Patient Details  Name: MAKALE PINDELL MRN: [id number] Date of Birth: [birth year] Referring Provider:   Flowsheet Row CARDIAC REHAB PHASE II ORIENTATION from 03/15/2023 in [facility] CARDIAC REHABILITATION  Referring Provider [name] MD       Encounter Date: 05/27/2023  Check In:  Session Check In - 05/27/23 0900       Check-In   Supervising physician immediately available to respond to emergencies See telemetry face sheet for immediately available MD    Location [facility]    Staff Present Ross Ludwig, BS, Exercise Physiologist;Zaiah Eckerson BSN, RN;Other    Virtual Visit No    Medication changes reported     No    Fall or balance concerns reported    No    Tobacco Cessation No Change    Warm-up and Cool-down Performed on first and last piece of equipment    Resistance Training Performed Yes    VAD Patient? No    PAD/SET Patient? No      Pain Assessment   Currently in Pain? No/denies    Multiple Pain Sites No             Capillary Blood Glucose: No results found for this or any previous visit (from the past 24 hour(s)).    Social History   Tobacco Use  Smoking Status Former   Current packs/day: 0.00   Types: Cigarettes   Start date: 11/01/1977   Quit date: 11/02/2022   Years since quitting: 0.5   Passive exposure: Never  Smokeless Tobacco Never    Goals Met:  Independence with exercise equipment Exercise tolerated well No report of concerns or symptoms today Strength training completed today  Goals Unmet:  Not Applicable  Comments: Marland KitchenMarland KitchenPt able to follow exercise prescription today without complaint.  Will continue to monitor for progression.

## 2023-05-30 ENCOUNTER — Encounter (HOSPITAL_COMMUNITY)
Admission: RE | Admit: 2023-05-30 | Discharge: 2023-05-30 | Disposition: A | Discharge status: Home / Self Care | Payer: BC Managed Care – PPO | Source: Ambulatory Visit | Attending: Cardiology | Admitting: Cardiology

## 2023-05-30 ENCOUNTER — Encounter (HOSPITAL_COMMUNITY): Payer: BC Managed Care – PPO

## 2023-05-30 DIAGNOSIS — Z955 Presence of coronary angioplasty implant and graft: Secondary | ICD-10-CM

## 2023-05-30 DIAGNOSIS — I2111 ST elevation (STEMI) myocardial infarction involving right coronary artery: Secondary | ICD-10-CM | POA: Diagnosis not present

## 2023-05-30 NOTE — Progress Notes (Addendum)
Daily Session Note  Patient Details  Name: Daniel Bautista MRN: [id number] Date of Birth: 1961-04-15 Referring Provider:   Flowsheet Row CARDIAC REHAB PHASE II ORIENTATION from 03/15/2023 in [facility] CARDIAC REHABILITATION  Referring Provider [name] MD       Encounter Date: 05/30/2023  Check In:  Session Check In - 05/30/23 0900       Check-In   Supervising physician immediately available to respond to emergencies See telemetry face sheet for immediately available ER MD    Location [facility]    Staff Present Rodena Medin, RN, BSN;Heather Gaynell Face, Exercise Physiologist    Virtual Visit No    Comments Patient started on [place] weekly; Added Repatha every 2 weeks.    Fall or balance concerns reported    No    Warm-up and Cool-down Performed on first and last piece of equipment    Resistance Training Performed Yes    VAD Patient? No    PAD/SET Patient? No      Pain Assessment   Currently in Pain? No/denies    Multiple Pain Sites No             Capillary Blood Glucose: No results found for this or any previous visit (from the past 24 hour(s)).    Social History   Tobacco Use  Smoking Status Former   Current packs/day: 0.00   Types: Cigarettes   Start date: 11/01/1977   Quit date: 11/02/2022   Years since quitting: 0.5   Passive exposure: Never  Smokeless Tobacco Never    Goals Met:  Independence with exercise equipment Exercise tolerated well No report of concerns or symptoms today Strength training completed today  Goals Unmet:  Not Applicable  Comments:  Daniel Bautista graduated today from  rehab with 36 sessions completed.  Details of the patient's exercise prescription and what He needs to do in order to continue the prescription and progress were discussed with patient.  Patient was given a copy of prescription and goals.  Patient verbalized understanding. Daniel Bautista plans to continue to exercise by walking at home.

## 2023-06-01 ENCOUNTER — Encounter (HOSPITAL_COMMUNITY): Payer: BC Managed Care – PPO

## 2023-06-03 ENCOUNTER — Encounter (HOSPITAL_COMMUNITY): Payer: BC Managed Care – PPO

## 2023-06-06 ENCOUNTER — Encounter (HOSPITAL_COMMUNITY): Payer: BC Managed Care – PPO

## 2023-06-06 ENCOUNTER — Telehealth: Payer: Self-pay | Admitting: Pharmacist

## 2023-06-06 MED ORDER — MOUNJARO 10 MG/0.5ML ~~LOC~~ SOAJ: 10.0000 mg | SUBCUTANEOUS | 0 refills | Status: DC

## 2023-06-06 NOTE — Telephone Encounter (Signed)
Call to titrate Mounjaro dose from 7.5 mg once week to 10 mg once a week. N/A LVM

## 2023-06-06 NOTE — Telephone Encounter (Signed)
Spoke to patient reports FBG ~100-120 mg/dl range current [id number] meds metformin 500 mg once day and Mounjaro 7.5 mg once a week. Appetite is very small but able to tolerate 7.5 mg Mounjaro dose. In agreement to titrate dose to 10 mg once week. His BMI is  25.86 so will consider to stop dose titration at 10 mg dose. Will follow up in 3-4 weeks to assess Mounjaro 10 mg tolerability and send more refills

## 2023-06-06 NOTE — Addendum Note (Signed)
Addended by: Tylene Fantasia on: 06/06/2023 09:32 AM   Modules accepted: Orders

## 2023-06-07 NOTE — Progress Notes (Signed)
Discharge Progress Report  Patient Details  Name: Daniel Bautista MRN: [id number] Date of Birth: 25-May-1961 Referring Provider:   Flowsheet Row CARDIAC REHAB PHASE II ORIENTATION from 03/15/2023 in [facility] CARDIAC REHABILITATION  Referring Provider [name] MD        Number of Visits: 36  Reason for Discharge:  Patient reached a stable level of exercise. Patient independent in their exercise. Patient has met program and personal goals.  Smoking History:  Social History   Tobacco Use  Smoking Status Former   Current packs/day: 0.00   Types: Cigarettes   Start date: 11/01/1977   Quit date: 11/02/2022   Years since quitting: 0.5   Passive exposure: Never  Smokeless Tobacco Never    Diagnosis:  ST elevation myocardial infarction involving right coronary artery (HCC)  Status post coronary artery stent placement  Initial Exercise Prescription:  Initial Exercise Prescription - 03/15/23 1000       Date of Initial Exercise RX and Referring Provider   Date 03/15/23    Referring Provider [name] MD      Oxygen   Maintain Oxygen Saturation 88% or higher      Treadmill   MPH 2.2    Grade 0.5    Minutes 15    METs 2.84      REL-XR   Level 2    Speed 50    Minutes 15    METs 2.5      Prescription Details   Frequency (times per week) 3    Duration Progress to 30 minutes of continuous aerobic without signs/symptoms of physical distress      Intensity   THRR 40-80% of Max Heartrate 97-138    Ratings of Perceived Exertion 11-13    Perceived Dyspnea 0-4      Progression   Progression Continue to progress workloads to maintain intensity without signs/symptoms of physical distress.      Resistance Training   Training Prescription Yes    Weight 4 lb    Reps 10-15             Discharge Exercise Prescription (Final Exercise Prescription Changes):  Exercise Prescription Changes - 05/23/23 1348       Response to Exercise   Blood Pressure  (Admit) 118/60    Blood Pressure (Exit) 122/72    Heart Rate (Admit) 74 bpm    Heart Rate (Exercise) 103 bpm    Heart Rate (Exit) 73 bpm    Rating of Perceived Exertion (Exercise) 12    Duration Continue with 30 min of aerobic exercise without signs/symptoms of physical distress.    Intensity THRR unchanged      Progression   Progression Continue to progress workloads to maintain intensity without signs/symptoms of physical distress.      Resistance Training   Training Prescription Yes    Weight 5 lbs    Reps 10-15      Treadmill   MPH 2.2    Grade 0.5    Minutes 15    METs 2.84      REL-XR   Level 4    Speed 62    Minutes 15    METs 4.1      Oxygen   Maintain Oxygen Saturation 88% or higher             Functional Capacity:  6 Minute Walk     Row Name 03/15/23 0957 05/20/23 0944       6 Minute Walk   Phase  Initial Discharge    Distance 1200 feet 1665 feet  initial recalculated to 1330    Distance % Change -- 25.2 %    Distance Feet Change -- 335 ft    Walk Time 6 minutes 6 minutes    # of Rest Breaks 0 0    MPH 2.27 3.15    METS 2.68 4.11    RPE 9 11    VO2 Peak 9.37 14.39    Symptoms No Yes (comment)    Comments -- sciattic pain in buttocks 7/10    Resting HR 57 bpm 56 bpm    Resting BP 104/58 90/50    Resting Oxygen Saturation  97 % --    Exercise Oxygen Saturation  during 6 min walk 96 % --    Max Ex. HR 113 bpm 108 bpm    Max Ex. BP 128/62 124/64    2 Minute Post BP 106/64 --             Psychological, QOL, Others - Outcomes: PHQ 2/9:    03/15/2023    8:39 AM  Depression screen PHQ 2/9  Decreased Interest 0  Down, Depressed, Hopeless 0  PHQ - 2 Score 0  Altered sleeping 0  Tired, decreased energy 1  Change in appetite 2  Feeling bad or failure about yourself  0  Trouble concentrating 0  Moving slowly or fidgety/restless 0  Suicidal thoughts 0  PHQ-9 Score 3  Difficult doing work/chores Not difficult at all   Nutrition &  Weight - Outcomes:  Pre Biometrics - 03/15/23 1005       Pre Biometrics   Height 5' 8.25" (1.734 m)    Weight 80.2 kg    Waist Circumference 36 inches    Hip Circumference 37.5 inches    Waist to Hip Ratio 0.96 %    BMI (Calculated) 26.69    Grip Strength 30.7 kg    Single Leg Stand 10.2 seconds             Post Biometrics - 05/20/23 0945        Post  Biometrics   Height 5' 8.25" (1.734 m)    Weight 77.7 kg    Waist Circumference 35 inches    Hip Circumference 38 inches    Waist to Hip Ratio 0.92 %    BMI (Calculated) 25.86    Grip Strength 35 kg    Single Leg Stand 4.2 seconds

## 2023-06-07 NOTE — Progress Notes (Signed)
Cardiac Individual Treatment Plan  Patient Details  Name: Daniel Bautista MRN: [id number] Date of Birth: 02/03/1961 Referring Provider:   Flowsheet Row CARDIAC REHAB PHASE II ORIENTATION from 03/15/2023 in [facility] CARDIAC REHABILITATION  Referring Provider [name] MD       Initial Encounter Date:  Flowsheet Row CARDIAC REHAB PHASE II ORIENTATION from 03/15/2023 in [place] CARDIAC REHABILITATION  Date 03/15/23       Visit Diagnosis: ST elevation myocardial infarction involving right coronary artery [facility])  Status post coronary artery stent placement  Patient's Home Medications on Admission:  Current Outpatient Medications:    Accu-Chek Softclix Lancets lancets, Use as directed, Disp: 100 each, Rfl: 0   aspirin EC 81 MG tablet, Take 1 tablet (81 mg total) by mouth daily. Swallow whole., Disp: 90 tablet, Rfl: 3   atorvastatin (LIPITOR) 80 MG tablet, Take 1 tablet (80 mg total) by mouth daily., Disp: 90 tablet, Rfl: 3   blood glucose meter kit and supplies, Dispense based on patient and insurance preference. Use up to four times daily as directed. (FOR ICD-10 E10.9, E11.9)., Disp: 1 each, Rfl: 0   Blood Glucose Monitoring Suppl KIT, 1 Units by Does not apply route daily., Disp: 1 kit, Rfl: 0   dapagliflozin propanediol (FARXIGA) 10 MG TABS tablet, Take 1 tablet (10 mg total) by mouth daily before breakfast., Disp: 30 tablet, Rfl: 11   Evolocumab (REPATHA SURECLICK) 140 MG/ML SOAJ, Inject 140 mg into the skin every 14 (fourteen) days., Disp: 2 mL, Rfl: 11   glucose blood (ONETOUCH VERIO) test strip, Use as instructed, Disp: 100 each, Rfl: 12   Insulin Pen Needle 32G X 4 MM MISC, Use daily, Disp: 100 each, Rfl: 3   metFORMIN (GLUCOPHAGE) 500 MG tablet, Take 1 tablet (500 mg total) by mouth daily with breakfast., Disp: 30 tablet, Rfl: 11   metoprolol succinate (TOPROL XL) 50 MG 24 hr tablet, Take 1 tablet (50 mg total) by mouth daily. Take with or immediately following a  meal., Disp: 90 tablet, Rfl: 3   nitroGLYCERIN (NITROSTAT) 0.4 MG SL tablet, Place 1 tablet (0.4 mg total) under the tongue every 5 (five) minutes x 3 doses as needed for chest pain., Disp: 25 tablet, Rfl: 0   sacubitril-valsartan (ENTRESTO) 49-51 MG, Take 1 tablet by mouth 2 (two) times daily., Disp: 180 tablet, Rfl: 3   spironolactone (ALDACTONE) 25 MG tablet, Take 1 tablet (25 mg total) by mouth daily., Disp: 90 tablet, Rfl: 3   ticagrelor (BRILINTA) 90 MG TABS tablet, Take 1 tablet (90 mg total) by mouth 2 (two) times daily., Disp: 180 tablet, Rfl: 1   tirzepatide (MOUNJARO) 10 MG/0.5ML Pen, Inject 10 mg into the skin once a week., Disp: 2 mL, Rfl: 0  Past Medical History: Past Medical History:  Diagnosis Date   Acute ST elevation myocardial infarction (STEMI) of inferior wall (HCC) 11/02/2022   Coronary artery disease involving native coronary artery of native heart with unstable angina pectoris (HCC) 11/03/2022   04/04/2023     Culprit lesion: Heavily thrombotic ulcerated /calcified prox-mid RCA 100% occlusion around crux after an RVM branch.  (After angioplasty and stenting there is a long area of the distal RCA into the PDA which is about 65% stenosed => improved some w/ NTG IC, so plan MedRx; OM1 90%, D1 80% (Med Rx).   Current smoker 11/03/2022   Hyperlipidemia associated with type 2 diabetes mellitus (HCC) 11/03/2022   Ischemic cardiomyopathy 11/03/2022   TTE 11/03/2022-s/p inferior STEMI:  EF 35 to 40%.  Moderate reduced function.  Global HK.  GR 2 DD.  D-shaped IVS consistent with RV volume overload.  RV function mildly reduced, mildly enlarged.   Presence of drug coated stent in right coronary artery 11/03/2022   Overlapping Synergr DES 2.5 x 48 & 2.75 x 12 - tapered post-dilation 3.1-2.8-2.6 mm (prox-~distal RCA) in setting of Inf STEMI -100% prox RCA   Type 2 diabetes mellitus with complication, without long-term current use of insulin (HCC) 11/03/2022    Tobacco Use: Social  History   Tobacco Use  Smoking Status Former   Current packs/day: 0.00   Types: Cigarettes   Start date: 11/01/1977   Quit date: 11/02/2022   Years since quitting: 0.5   Passive exposure: Never  Smokeless Tobacco Never    Labs: Review Flowsheet       Latest Ref Rng & Units 11/02/2022 11/03/2022 01/04/2023  Labs for ITP Cardiac and Pulmonary Rehab  Cholestrol 0 - 200 mg/dL 161  096  045   LDL (calc) 0 - 99 mg/dL 409  811  73   HDL-C >91 mg/dL 41  39  33   Trlycerides <150 mg/dL 478  295  621   Hemoglobin A1c 4.8 - 5.6 % 12.2  11.7  -    Details            Capillary Blood Glucose: Lab Results  Component Value Date   GLUCAP 163 (H) 04/01/2023   GLUCAP 216 (H) 04/01/2023   GLUCAP 134 (H) 03/23/2023   GLUCAP 187 (H) 03/23/2023   GLUCAP 221 (H) 03/21/2023     Exercise Target Goals: Exercise Program Goal: Individual exercise prescription set using results from initial 6 min walk test and THRR while considering  patient's activity barriers and safety.   Exercise Prescription Goal: Starting with aerobic activity 30 plus minutes a day, 3 days per week for initial exercise prescription. Provide home exercise prescription and guidelines that participant acknowledges understanding prior to discharge.  Activity Barriers & Risk Stratification:  Activity Barriers & Cardiac Risk Stratification - 03/14/23 1103       Activity Barriers & Cardiac Risk Stratification   Activity Barriers Deconditioning;Muscular Weakness;Back Problems   occassional back pain   Cardiac Risk Stratification Moderate             6 Minute Walk:  6 Minute Walk     Row Name 03/15/23 0957 05/20/23 0944       6 Minute Walk   Phase Initial Discharge    Distance 1200 feet 1665 feet  initial recalculated to 1330    Distance % Change -- 25.2 %    Distance Feet Change -- 335 ft    Walk Time 6 minutes 6 minutes    # of Rest Breaks 0 0    MPH 2.27 3.15    METS 2.68 4.11    RPE 9 11    VO2 Peak 9.37  14.39    Symptoms No Yes (comment)    Comments -- sciattic pain in buttocks 7/10    Resting HR 57 bpm 56 bpm    Resting BP 104/58 90/50    Resting Oxygen Saturation  97 % --    Exercise Oxygen Saturation  during 6 min walk 96 % --    Max Ex. HR 113 bpm 108 bpm    Max Ex. BP 128/62 124/64    2 Minute Post BP 106/64 --  Oxygen Initial Assessment:   Oxygen Re-Evaluation:   Oxygen Discharge (Final Oxygen Re-Evaluation):   Initial Exercise Prescription:  Initial Exercise Prescription - 03/15/23 1000       Date of Initial Exercise RX and Referring Provider   Date 03/15/23    Referring Provider [name] MD      Oxygen   Maintain Oxygen Saturation 88% or higher      Treadmill   MPH 2.2    Grade 0.5    Minutes 15    METs 2.84      REL-XR   Level 2    Speed 50    Minutes 15    METs 2.5      Prescription Details   Frequency (times per week) 3    Duration Progress to 30 minutes of continuous aerobic without signs/symptoms of physical distress      Intensity   THRR 40-80% of Max Heartrate 97-138    Ratings of Perceived Exertion 11-13    Perceived Dyspnea 0-4      Progression   Progression Continue to progress workloads to maintain intensity without signs/symptoms of physical distress.      Resistance Training   Training Prescription Yes    Weight 4 lb    Reps 10-15             Perform Capillary Blood Glucose checks as needed.  Exercise Prescription Changes:   Exercise Prescription Changes     Row Name 03/15/23 1000 04/01/23 1300 04/25/23 1300 05/09/23 1300 05/23/23 1348     Response to Exercise   Blood Pressure (Admit) 104/58 92/60 90/60  106/64 118/60   Blood Pressure (Exercise) 128/62 100/52 -- -- --   Blood Pressure (Exit) 106/64 98/48 96/60  98/90 122/72   Heart Rate (Admit) 57 bpm 63 bpm 57 bpm 58 bpm 74 bpm   Heart Rate (Exercise) 113 bpm 105 bpm 93 bpm 96 bpm 103 bpm   Heart Rate (Exit) 60 bpm 66 bpm 64 bpm 68 bpm 73 bpm    Oxygen Saturation (Admit) 97 % -- -- -- --   Oxygen Saturation (Exercise) 96 % -- -- -- --   Rating of Perceived Exertion (Exercise) 9 11 12 12 12    Symptoms none -- -- -- --   Comments walk test results -- -- -- --   Duration -- Continue with 30 min of aerobic exercise without signs/symptoms of physical distress. Continue with 30 min of aerobic exercise without signs/symptoms of physical distress. Continue with 30 min of aerobic exercise without signs/symptoms of physical distress. Continue with 30 min of aerobic exercise without signs/symptoms of physical distress.   Intensity -- THRR unchanged THRR unchanged THRR unchanged THRR unchanged     Progression   Progression -- Continue to progress workloads to maintain intensity without signs/symptoms of physical distress. Continue to progress workloads to maintain intensity without signs/symptoms of physical distress. Continue to progress workloads to maintain intensity without signs/symptoms of physical distress. Continue to progress workloads to maintain intensity without signs/symptoms of physical distress.     Resistance Training   Training Prescription -- Yes Yes Yes Yes   Weight -- 5 5 5  lbs 5 lbs   Reps -- 10-15 10-15 10-15 10-15   Time -- -- -- 10 Minutes --     Treadmill   MPH -- 2.2 2.2 2.2 2.2   Grade -- 0 0.5 0.5 0.5   Minutes -- 15 15 15 15    METs -- 2.69 2.84 2.84 2.84  REL-XR   Level -- 2 3 3 4    Speed -- 56 56 55 62   Minutes -- 15 15 15 15    METs -- 3.4 3.2 3.8 4.1     Oxygen   Maintain Oxygen Saturation -- -- -- 88% or higher 88% or higher            Exercise Comments:   Exercise Comments     Row Name 03/21/23 1116 05/30/23 0800         Exercise Comments First full day of exercise!  Patient was oriented to gym and equipment including functions, settings, policies, and procedures.  Patient's individual exercise prescription and treatment plan were reviewed.  All starting workloads were established  based on the results of the 6 minute walk test done at initial orientation visit.  The plan for exercise progression was also introduced and progression will be customized based on patient's performance and goals. Zylon graduated today from  rehab with 36 sessions completed.  Details of the patient's exercise prescription and what He needs to do in order to continue the prescription and progress were discussed with patient.  Patient was given a copy of prescription and goals.  Patient verbalized understanding. Lefty plans to continue to exercise by walking at home.               Exercise Goals and Review:   Exercise Goals     Row Name 03/15/23 1004             Exercise Goals   Increase Physical Activity Yes       Intervention Provide advice, education, support and counseling about physical activity/exercise needs.;Develop an individualized exercise prescription for aerobic and resistive training based on initial evaluation findings, risk stratification, comorbidities and participant's personal goals.       Expected Outcomes Short Term: Attend rehab on a regular basis to increase amount of physical activity.;Long Term: Add in home exercise to make exercise part of routine and to increase amount of physical activity.;Long Term: Exercising regularly at least 3-5 days a week.       Increase Strength and Stamina Yes       Intervention Provide advice, education, support and counseling about physical activity/exercise needs.;Develop an individualized exercise prescription for aerobic and resistive training based on initial evaluation findings, risk stratification, comorbidities and participant's personal goals.       Expected Outcomes Short Term: Increase workloads from initial exercise prescription for resistance, speed, and METs.;Short Term: Perform resistance training exercises routinely during rehab and add in resistance training at home;Long Term: Improve cardiorespiratory fitness, muscular  endurance and strength as measured by increased METs and functional capacity ( )       Able to understand and use rate of perceived exertion (RPE) scale Yes       Intervention Provide education and explanation on how to use RPE scale       Expected Outcomes Short Term: Able to use RPE daily in rehab to express subjective intensity level;Long Term:  Able to use RPE to guide intensity level when exercising independently       Able to understand and use Dyspnea scale Yes       Intervention Provide education and explanation on how to use Dyspnea scale       Expected Outcomes Short Term: Able to use Dyspnea scale daily in rehab to express subjective sense of shortness of breath during exertion;Long Term: Able to use Dyspnea scale to guide intensity level when exercising  independently       Knowledge and understanding of Target Heart Rate Range (THRR) Yes       Intervention Provide education and explanation of THRR including how the numbers were predicted and where they are located for reference       Expected Outcomes Long Term: Able to use THRR to govern intensity when exercising independently;Short Term: Able to state/look up THRR;Short Term: Able to use daily as guideline for intensity in rehab       Able to check pulse independently Yes       Intervention Provide education and demonstration on how to check pulse in carotid and radial arteries.;Review the importance of being able to check your own pulse for safety during independent exercise       Expected Outcomes Short Term: Able to explain why pulse checking is important during independent exercise;Long Term: Able to check pulse independently and accurately       Understanding of Exercise Prescription Yes       Intervention Provide education, explanation, and written materials on patient's individual exercise prescription       Expected Outcomes Short Term: Able to explain program exercise prescription;Long Term: Able to explain home exercise  prescription to exercise independently                Exercise Goals Re-Evaluation :  Exercise Goals Re-Evaluation     Row Name 03/15/23 1005 04/01/23 1322 04/13/23 0923 04/27/23 1320 05/02/23 0932     Exercise Goal Re-Evaluation   Exercise Goals Review Able to understand and use rate of perceived exertion (RPE) scale;Able to understand and use Dyspnea scale;Understanding of Exercise Prescription Increase Physical Activity;Understanding of Exercise Prescription;Increase Strength and Stamina Increase Strength and Stamina;Understanding of Exercise Prescription;Increase Physical Activity Increase Physical Activity;Able to understand and use rate of perceived exertion (RPE) scale;Increase Strength and Stamina;Understanding of Exercise Prescription Increase Physical Activity;Increase Strength and Stamina;Able to understand and use rate of perceived exertion (RPE) scale;Able to understand and use Dyspnea scale;Knowledge and understanding of Target Heart Rate Range (THRR);Able to check pulse independently   Comments Reviewed RPE  and dyspnea scale, and program prescription with pt today.  Pt voiced understanding and was given a copy of goals to take home. Soyla Murphy has not increased his workload or levels on his equipment in the last two weeks. He is exercising at level 2 on the XR and 2.2 speed on the treadmill. Will continue to monitor as able. Dajion has continued to increase his workload/levels on his equipment.  He is exercising at 2.2 speed and 0.5 incline on the treadmill and is going to try level 3 today on the ellpictical.  We will continue to monitor his progress. Voyle is doing well in rehab. He has increased his workload on the elliptical ro level 3. He has not increased his speed or grade on the treadmill. He continue to exercise at an RPE of 12 and 3.2 METS. Will continue to monitor and progress as able. Erineo is doing well in rehab.  He is walking some on his off days.  Reviewed home exercise  with pt today.  Pt plans to walk at home for exercise.  Reviewed THR, pulse, RPE, sign and symptoms, pulse oximetery and when to call 911 or MD.  Also discussed weather considerations and indoor options.  Pt voiced understanding.   Expected Outcomes Short: Use RPE daily to regulate intensity.  Long: Follow program prescription Short term: Increase level on the NuStep to level 3 in the  next week   long term: continue to attend cardiac rehab Short term:  Go over home exercise program  Long term:  Continue to increase his workload/levels while in rehab Short term: increase speed on treadmill in the next two weeks  long term: continue to attend rehab Short: Start to add in more walking at home Long: Conitnue to improve stamina    Row Name 05/09/23 1342             Exercise Goal Re-Evaluation   Exercise Goals Review Increase Physical Activity;Increase Strength and Stamina;Understanding of Exercise Prescription       Comments Hervie is tolerating exercise well. He has nit increased his level or speed on the treadmill in the past two weeks. His RPM has increased on the XR slightly. Will continue to monitor and progress as able.       Expected Outcomes Short term: Increase his level on the XR in the next week   long term: continue to attend rehab                 Discharge Exercise Prescription (Final Exercise Prescription Changes):  Exercise Prescription Changes - 05/23/23 1348       Response to Exercise   Blood Pressure (Admit) 118/60    Blood Pressure (Exit) 122/72    Heart Rate (Admit) 74 bpm    Heart Rate (Exercise) 103 bpm    Heart Rate (Exit) 73 bpm    Rating of Perceived Exertion (Exercise) 12    Duration Continue with 30 min of aerobic exercise without signs/symptoms of physical distress.    Intensity THRR unchanged      Progression   Progression Continue to progress workloads to maintain intensity without signs/symptoms of physical distress.      Resistance Training   Training  Prescription Yes    Weight 5 lbs    Reps 10-15      Treadmill   MPH 2.2    Grade 0.5    Minutes 15    METs 2.84      REL-XR   Level 4    Speed 62    Minutes 15    METs 4.1      Oxygen   Maintain Oxygen Saturation 88% or higher             Nutrition:  Target Goals: Understanding of nutrition guidelines, daily intake of sodium 1500mg , cholesterol 200mg , calories 30% from fat and 7% or less from saturated fats, daily to have 5 or more servings of fruits and vegetables.  Biometrics:  Pre Biometrics - 03/15/23 1005       Pre Biometrics   Height 5' 8.25" (1.734 m)    Weight 80.2 kg    Waist Circumference 36 inches    Hip Circumference 37.5 inches    Waist to Hip Ratio 0.96 %    BMI (Calculated) 26.69    Grip Strength 30.7 kg    Single Leg Stand 10.2 seconds             Post Biometrics - 05/20/23 0945        Post  Biometrics   Height 5' 8.25" (1.734 m)    Weight 77.7 kg    Waist Circumference 35 inches    Hip Circumference 38 inches    Waist to Hip Ratio 0.92 %    BMI (Calculated) 25.86    Grip Strength 35 kg    Single Leg Stand 4.2 seconds  Nutrition Therapy Plan and Nutrition Goals:  Nutrition Therapy & Goals - 03/14/23 1119       Intervention Plan   Intervention Prescribe, educate and counsel regarding individualized specific dietary modifications aiming towards targeted core components such as weight, hypertension, lipid management, diabetes, heart failure and other comorbidities.;Nutrition handout(s) given to patient.    Expected Outcomes Short Term Goal: Understand basic principles of dietary content, such as calories, fat, sodium, cholesterol and nutrients.             Nutrition Assessments:  MEDIFICTS Score Key: >=70 Need to make dietary changes  40-70 Heart Healthy Diet <= 40 Therapeutic Level Cholesterol Diet  Flowsheet Row CARDIAC REHAB PHASE II ORIENTATION from 03/15/2023 in [facility] CARDIAC REHABILITATION   Picture Your Plate Total Score on Admission 48      Picture Your Plate Scores: <16 Unhealthy dietary pattern with much room for improvement. 41-50 Dietary pattern unlikely to meet recommendations for good health and room for improvement. 51-60 More healthful dietary pattern, with some room for improvement.  >60 Healthy dietary pattern, although there may be some specific behaviors that could be improved.    Nutrition Goals Re-Evaluation:  Nutrition Goals Re-Evaluation     Row Name 04/13/23 0933 05/02/23 0938           Goals   Nutrition Goal Heart healthy diet Short term: Add more fruit into his diet Long term: Maintain a balance of fruit/vegetables every day in his diet      Comment Pt is currently trying to stay away from sweets and is also limiting his intake of red meat.  He eats a lot of grilled/baked chicken, but he states that he does not eat a lot of vegetables or fruits.  Pt states he has lost a few pounds since starting the program, and he has recently started on Mounjaro.  He currently checks his blood sugar every day. Blakley is doing well in rehab. He feels like he is doing well with his diet.  He has not tried to add any more fruit in yet.  He says that things don't taste good.  He was encouraged to keep trying new foods.  He is trying to balance meals with protein and sticking to lean proteins. He has not had much appetite so portions are doing well. He has not talked to doctor about it yet, but will bring it up at appt next month.      Expected Outcome Short term: Add more fruit into his diet                  Long term:  Maintain a balance of fruit/vegetables every day in his diet Short: Keep balancing meals with protein Long: Conitnue to eat to maintain               Nutrition Goals Discharge (Final Nutrition Goals Re-Evaluation):  Nutrition Goals Re-Evaluation - 05/02/23 0938       Goals   Nutrition Goal Short term: Add more fruit into his diet Long term: Maintain  a balance of fruit/vegetables every day in his diet    Comment Hiyan is doing well in rehab. He feels like he is doing well with his diet.  He has not tried to add any more fruit in yet.  He says that things don't taste good.  He was encouraged to keep trying new foods.  He is trying to balance meals with protein and sticking to lean proteins. He has not had  much appetite so portions are doing well. He has not talked to doctor about it yet, but will bring it up at appt next month.    Expected Outcome Short: Keep balancing meals with protein Long: Conitnue to eat to maintain             Psychosocial: Target Goals: Acknowledge presence or absence of significant depression and/or stress, maximize coping skills, provide positive support system. Participant is able to verbalize types and ability to use techniques and skills needed for reducing stress and depression.  Initial Review & Psychosocial Screening:  Initial Psych Review & Screening - 03/14/23 1105       Initial Review   Current issues with Current Stress Concerns    Source of Stress Concerns Financial;Unable to participate in former interests or hobbies;Occupation;Retirement/disability    Comments Just retired from work on 7/25, quit smoking in March, takes his time, gets about 6 hr a night for sleep      Family Dynamics   Good Support System? Yes   wife     Barriers   Psychosocial barriers to participate in program The patient should benefit from training in stress management and relaxation.;Psychosocial barriers identified (see note)      Screening Interventions   Interventions Encouraged to exercise;Provide feedback about the scores to participant;To provide support and resources with identified psychosocial needs    Expected Outcomes Short Term goal: Utilizing psychosocial counselor, staff and physician to assist with identification of specific Stressors or current issues interfering with healing process. Setting desired goal  for each stressor or current issue identified.;Long Term Goal: Stressors or current issues are controlled or eliminated.;Short Term goal: Identification and review with participant of any Quality of Life or Depression concerns found by scoring the questionnaire.;Long Term goal: The participant improves quality of Life and PHQ9 Scores as seen by post scores and/or verbalization of changes             Quality of Life Scores:  Quality of Life - 03/15/23 1006       Quality of Life   Select Quality of Life      Quality of Life Scores   Health/Function Pre 24.8 %    Socioeconomic Pre 27.43 %    Psych/Spiritual Pre 30 %    Family Pre 30 %    GLOBAL Pre 27.18 %            Scores of 19 and below usually indicate a poorer quality of life in these areas.  A difference of  2-3 points is a clinically meaningful difference.  A difference of 2-3 points in the total score of the Quality of Life Index has been associated with significant improvement in overall quality of life, self-image, physical symptoms, and general health in studies assessing change in quality of life.  PHQ-9: Review Flowsheet       03/15/2023  Depression screen PHQ 2/9  Decreased Interest 0  Down, Depressed, Hopeless 0  PHQ - 2 Score 0  Altered sleeping 0  Tired, decreased energy 1  Change in appetite 2  Feeling bad or failure about yourself  0  Trouble concentrating 0  Moving slowly or fidgety/restless 0  Suicidal thoughts 0  PHQ-9 Score 3  Difficult doing work/chores Not difficult at all    Details           Interpretation of Total Score  Total Score Depression Severity:  1-4 = Minimal depression, 5-9 = Mild depression, 10-14 = Moderate depression, 15-19 =  Moderately severe depression, 20-27 = Severe depression   Psychosocial Evaluation and Intervention:  Psychosocial Evaluation - 03/14/23 1111       Psychosocial Evaluation & Interventions   Interventions Encouraged to exercise with the program and  follow exercise prescription;Stress management education    Comments Sudeys is coming into cardiac rehab 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  He is doing well from a cardiac standpoint.  He has recently quit smoking after his heart attack and has not had any cravings.  He is feeling good about his accomplishment and has been praised by all providers.  He offically retired last week to reduce stress in his life and is feeling pretty good about it.  He tries not to let too much get to him.  It is just him and his wife who is a great supporter for him.  He sleeps well most nights.  He enjoys getting out to fish, but has not been able to do much with the heat this summer. He is not sure what to expect from program but would like to build stamina and feel a bit more confident after his heart event.    Expected Outcomes Short: Attend rehab to boost stamina Long: Build confidence to live life without worry about heart    Continue Psychosocial Services  Follow up required by staff             Psychosocial Re-Evaluation:  Psychosocial Re-Evaluation     Row Name 04/13/23 0929 05/02/23 0936           Psychosocial Re-Evaluation   Current issues with Current Sleep Concerns Current Sleep Concerns;Current Stress Concerns      Comments Pt has a hx of only getting 6 hours of sleep due to having to get up and use the bathroom about every 2 hours.  He currently does not take any sleep medications and states that he does not take any medication for overactive bladder. Josephus is doing well in rehab.  He is feeling good mentally.  His biggest stressor right now is his mom just passed and he has her funeral to attend and things to wrap up. He is still not sleeping the best with trips to bathroom.  He is usually able to get back to sleep afterwards.  He is usually up until 1130 at night.      Expected Outcomes Pt will continue to have no identifiable identifiable psychosocial barriers and will discuss his bladder  concerns with his primary care doctor as his next visit. Short: Cope with losing his mom Long; Continue to work on sleep      Interventions Stress management education;Relaxation education;Encouraged to attend Cardiac Rehabilitation for the exercise Encouraged to attend Cardiac Rehabilitation for the exercise;Stress management education      Continue Psychosocial Services  Follow up required by staff Follow up required by staff        Initial Review   Source of Stress Concerns -- Family               Psychosocial Discharge (Final Psychosocial Re-Evaluation):  Psychosocial Re-Evaluation - 05/02/23 0936       Psychosocial Re-Evaluation   Current issues with Current Sleep Concerns;Current Stress Concerns    Comments Zaylon is doing well in rehab.  He is feeling good mentally.  His biggest stressor right now is his mom just passed and he has her funeral to attend and things to wrap up. He is still not sleeping the best with  trips to bathroom.  He is usually able to get back to sleep afterwards.  He is usually up until 1130 at night.    Expected Outcomes Short: Cope with losing his mom Long; Continue to work on sleep    Interventions Encouraged to attend Cardiac Rehabilitation for the exercise;Stress management education    Continue Psychosocial Services  Follow up required by staff      Initial Review   Source of Stress Concerns Family             Vocational Rehabilitation: Provide vocational rehab assistance to qualifying candidates.   Vocational Rehab Evaluation & Intervention:  Vocational Rehab - 03/14/23 1104       Initial Vocational Rehab Evaluation & Intervention   Assessment shows need for Vocational Rehabilitation No   just retired Thursday  03/10/23            Education: Education Goals: Education classes will be provided on a weekly basis, covering required topics. Participant will state understanding/return demonstration of topics presented.  Learning  Barriers/Preferences:  Learning Barriers/Preferences - 03/14/23 1102       Learning Barriers/Preferences   Learning Barriers Sight   glasses   Learning Preferences None             Education Topics: Hypertension, Hypertension Reduction -Define heart disease and high blood pressure. Discus how high blood pressure affects the body and ways to reduce high blood pressure.   Exercise and Your Heart -Discuss why it is important to exercise, the FITT principles of exercise, normal and abnormal responses to exercise, and how to exercise safely. Flowsheet Row CARDIAC REHAB PHASE II EXERCISE from 05/27/2023 in [place] CARDIAC REHABILITATION  Date 05/11/23  [05/25/23 part 2]  Educator [facility]  Instruction Review Code 1- Verbalizes Understanding       Angina -Discuss definition of angina, causes of angina, treatment of angina, and how to decrease risk of having angina. Flowsheet Row CARDIAC REHAB PHASE II EXERCISE from 05/27/2023 in [place] CARDIAC REHABILITATION  Date 05/04/23  Educator [facility]  Instruction Review Code 1- Verbalizes Understanding       Cardiac Medications -Review what the following cardiac medications are used for, how they affect the body, and side effects that may occur when taking the medications.  Medications include Aspirin, Beta blockers, calcium channel blockers, ACE Inhibitors, angiotensin receptor blockers, diuretics, digoxin, and antihyperlipidemics. Flowsheet Row CARDIAC REHAB PHASE II EXERCISE from 05/27/2023 in [place] CARDIAC REHABILITATION  Date 04/13/23  Educator DJ  Instruction Review Code 1- Verbalizes Understanding       Congestive Heart Failure -Discuss the definition of CHF, how to live with CHF, the signs and symptoms of CHF, and how keep track of weight and sodium intake. Flowsheet Row CARDIAC REHAB PHASE II EXERCISE from 05/27/2023 in [place] CARDIAC REHABILITATION  Date 04/27/23  Educator HB  Instruction Review Code 1- Verbalizes  Understanding       Heart Disease and Intimacy -Discus the effect sexual activity has on the heart, how changes occur during intimacy as we age, and safety during sexual activity. Flowsheet Row CARDIAC REHAB PHASE II EXERCISE from 05/27/2023 in [place] CARDIAC REHABILITATION  Date 03/23/23  Educator [facility]  Instruction Review Code 1- Verbalizes Understanding       Smoking Cessation / COPD -Discuss different methods to quit smoking, the health benefits of quitting smoking, and the definition of COPD. Flowsheet Row CARDIAC REHAB PHASE II EXERCISE from 05/27/2023 in [place] CARDIAC REHABILITATION  Date 04/20/23  Educator [facility]  Instruction Review Code 2- Demonstrated Understanding       Nutrition I: Fats -Discuss the types of cholesterol, what cholesterol does to the heart, and how cholesterol levels can be controlled.   Nutrition II: Labels -Discuss the different components of food labels and how to read food label   Heart Parts/Heart Disease and PAD -Discuss the anatomy of the heart, the pathway of blood circulation through the heart, and these are affected by heart disease.   Stress I: Signs and Symptoms -Discuss the causes of stress, how stress may lead to anxiety and depression, and ways to limit stress. Flowsheet Row CARDIAC REHAB PHASE II EXERCISE from 05/27/2023 in [place] CARDIAC REHABILITATION  Date 04/06/23  Educator [facility]  Instruction Review Code 1- Verbalizes Understanding       Stress II: Relaxation -Discuss different types of relaxation techniques to limit stress. Flowsheet Row CARDIAC REHAB PHASE II EXERCISE from 05/27/2023 in [place] CARDIAC REHABILITATION  Date 04/06/23  Educator [facility]  Instruction Review Code 1- Verbalizes Understanding       Warning Signs of Stroke / TIA -Discuss definition of a stroke, what the signs and symptoms are of a stroke, and how to identify when someone is having stroke.   Knowledge Questionnaire Score:   Knowledge Questionnaire Score - 03/15/23 1007       Knowledge Questionnaire Score   Pre Score 20/28             Core Components/Risk Factors/Patient Goals at Admission:  Personal Goals and Risk Factors at Admission - 03/15/23 1007       Core Components/Risk Factors/Patient Goals on Admission    Weight Management Yes;Weight Loss    Intervention Weight Management: Develop a combined nutrition and exercise program designed to reach desired caloric intake, while maintaining appropriate intake of nutrient and fiber, sodium and fats, and appropriate energy expenditure required for the weight goal.;Weight Management: Provide education and appropriate resources to help participant work on and attain dietary goals.;Weight Management/Obesity: Establish reasonable short term and long term weight goals.    Admit Weight 176 lb 14.4 oz (80.2 kg)    Goal Weight: Short Term 171 lb (77.6 kg)    Goal Weight: Long Term 165 lb (74.8 kg)    Expected Outcomes Short Term: Continue to assess and modify interventions until short term weight is achieved;Long Term: Adherence to nutrition and physical activity/exercise program aimed toward attainment of established weight goal;Weight Loss: Understanding of general recommendations for a balanced deficit meal plan, which promotes 1-2 lb weight loss per week and includes a negative energy balance of [phone] kcal/d;Understanding recommendations for meals to include 15-35% energy as protein, 25-35% energy from fat, 35-60% energy from carbohydrates, less than 200mg  of dietary cholesterol, 20-35 gm of total fiber daily;Understanding of distribution of calorie intake throughout the day with the consumption of 4-5 meals/snacks    Tobacco Cessation Yes   quit 11/02/22   Intervention Assist the participant in steps to quit. Provide individualized education and counseling about committing to Tobacco Cessation, relapse prevention, and pharmacological support that can be provided by  physician.    Expected Outcomes Long Term: Complete abstinence from all tobacco products for at least 12 months from quit date.;Short Term: Will quit all tobacco product use, adhering to prevention of relapse plan.    Diabetes Yes    Intervention Provide education about signs/symptoms and action to take for hypo/hyperglycemia.;Provide education about proper nutrition, including hydration, and aerobic/resistive exercise prescription along with prescribed medications  to achieve blood glucose in normal ranges: Fasting glucose 65-99 mg/dL    Expected Outcomes Short Term: Participant verbalizes understanding of the signs/symptoms and immediate care of hyper/hypoglycemia, proper foot care and importance of medication, aerobic/resistive exercise and nutrition plan for blood glucose control.;Long Term: Attainment of HbA1C < 7%.    Heart Failure Yes    Intervention Provide a combined exercise and nutrition program that is supplemented with education, support and counseling about heart failure. Directed toward relieving symptoms such as shortness of breath, decreased exercise tolerance, and extremity edema.    Expected Outcomes Improve functional capacity of life;Short term: Attendance in program 2-3 days a week with increased exercise capacity. Reported lower sodium intake. Reported increased fruit and vegetable intake. Reports medication compliance.;Short term: Daily weights obtained and reported for increase. Utilizing diuretic protocols set by physician.;Long term: Adoption of self-care skills and reduction of barriers for early signs and symptoms recognition and intervention leading to self-care maintenance.    Hypertension Yes    Intervention Provide education on lifestyle modifcations including regular physical activity/exercise, weight management, moderate sodium restriction and increased consumption of fresh fruit, vegetables, and low fat dairy, alcohol moderation, and smoking cessation.;Monitor prescription  use compliance.    Expected Outcomes Short Term: Continued assessment and intervention until BP is < 140/32mm HG in hypertensive participants. < 130/76mm HG in hypertensive participants with diabetes, heart failure or chronic kidney disease.;Long Term: Maintenance of blood pressure at goal levels.    Lipids Yes    Intervention Provide education and support for participant on nutrition & aerobic/resistive exercise along with prescribed medications to achieve LDL 70mg , HDL >40mg .    Expected Outcomes Short Term: Participant states understanding of desired cholesterol values and is compliant with medications prescribed. Participant is following exercise prescription and nutrition guidelines.;Long Term: Cholesterol controlled with medications as prescribed, with individualized exercise RX and with personalized nutrition plan. Value goals: LDL < 70mg , HDL > 40 mg.             Core Components/Risk Factors/Patient Goals Review:   Goals and Risk Factor Review     Row Name 03/14/23 1121 04/13/23 0944 05/02/23 0941         Core Components/Risk Factors/Patient Goals Review   Personal Goals Review Tobacco Cessation Weight Management/Obesity;Heart Failure;Diabetes;Hypertension;Tobacco Cessation;Lipids Weight Management/Obesity;Heart Failure;Diabetes;Hypertension;Tobacco Cessation;Lipids     Review Hanz has recently quit tobacco use within the last 6 months. He quit at time of heart event.  Intervention for relapse prevention was provided at the initial medical review. He was encouraged to continue to with tobacco cessation and was provided information on relapse prevention. Patient received information about combination therapy, tobacco cessation classes, quit line, and quit smoking apps in case of a relapse. Patient demonstrated understanding of this material.Staff will continue to provide encouragement and follow up with the patient throughout the program. Ogle quit smoking in March of 2024.  He  currently weighs himself everyday and knows to look for swelling/sudden weight gain.  He checks his blood sugar everyday and was recently started on Mounjaro to also assist with weight loss.  The pt stated that he has lost a few pounds since starting cardiac rehab.  He is taking Lipitor for HLD and is taking this medication as prescribed with no reported side effects.  The pt also takes his Metoprolol and Spironolactone as prescribed with no reported side effects. Croix is doing well in rehab.  His weight is holding steady.  He has not had any heart failure symptoms.  He has continued cessation and was commended for his success!!  His sugars are doing well and staying in the middle range.  He has not had any lows.  His pressures are doing well.  His biggest complaint is that nothing taste good and he does not have an appetitie.  He was encouraged to talk to his doctor about it.     Expected Outcomes Continued cessation Short term:  continue smoking cessation   Long term:  Get an electronic BP cuff to monitor his blood pressure at home daily Short: Talk to doctor about appetite Long: Conitnue to monitor risk factors.              Core Components/Risk Factors/Patient Goals at Discharge (Final Review):   Goals and Risk Factor Review - 05/02/23 0941       Core Components/Risk Factors/Patient Goals Review   Personal Goals Review Weight Management/Obesity;Heart Failure;Diabetes;Hypertension;Tobacco Cessation;Lipids    Review Thorin is doing well in rehab.  His weight is holding steady.  He has not had any heart failure symptoms.  He has continued cessation and was commended for his success!!  His sugars are doing well and staying in the middle range.  He has not had any lows.  His pressures are doing well.  His biggest complaint is that nothing taste good and he does not have an appetitie.  He was encouraged to talk to his doctor about it.    Expected Outcomes Short: Talk to doctor about appetite Long:  Conitnue to monitor risk factors.             ITP Comments:  ITP Comments     Row Name 03/14/23 1120 03/15/23 0956 03/21/23 1116 03/30/23 0809 04/27/23 0804   ITP Comments Completed virtual orientation today.  EP evaluation is scheduled for 03/15/23 at 830.  Documentation for diagnosis can be found in [facility] encounter 11/02/22. Patient attend orientation today.  Patient is attendingCardiac Rehabilitation Program.  Documentation for diagnosis can be found in [facility] encounter 11/02/22.  Reviewed medical chart, RPE/RPD, gym safety, and program guidelines.  Patient was fitted to equipment they will be using during rehab.  Patient is scheduled to start exercise on 03/21/23 at 930.   Initial ITP created and sent for review and signature by Dr. [name], Medical Director for Cardiac Rehabilitation Program. First full day of exercise!  Patient was oriented to gym and equipment including functions, settings, policies, and procedures.  Patient's individual exercise prescription and treatment plan were reviewed.  All starting workloads were established based on the results of the 6 minute walk test done at initial orientation visit.  The plan for exercise progression was also introduced and progression will be customized based on patient's performance and goals. 30 day review completed. ITP sent to Dr. [name], Medical Director of Cardiac Rehab. Continue with ITP unless changes are made by physician. 30 day review completed. ITP sent to Dr. [name], Medical Director of Cardiac Rehab. Continue with ITP unless changes are made by physician.    Row Name 05/25/23 0706 05/30/23 0959         ITP Comments 30 day review completed. ITP sent to Dr. [name], Medical Director of Cardiac Rehab. Continue with ITP unless changes are made by physician. Tilmon graduated today from  rehab with 36 sessions completed.  Details of the patient's exercise prescription and what He needs to do in order to continue  the prescription and progress were discussed with patient.  Patient was given  a copy of prescription and goals.  Patient verbalized understanding. Keandre plans to continue to exercise by walking at home.               Comments: Discharge ITP

## 2023-06-08 ENCOUNTER — Encounter (HOSPITAL_COMMUNITY): Payer: BC Managed Care – PPO

## 2023-06-10 ENCOUNTER — Encounter (HOSPITAL_COMMUNITY): Payer: BC Managed Care – PPO

## 2023-06-13 ENCOUNTER — Encounter (HOSPITAL_COMMUNITY): Payer: BC Managed Care – PPO

## 2023-06-15 ENCOUNTER — Encounter (HOSPITAL_COMMUNITY): Payer: BC Managed Care – PPO

## 2023-06-16 DIAGNOSIS — H43823 Vitreomacular adhesion, bilateral: Secondary | ICD-10-CM | POA: Diagnosis not present

## 2023-06-16 DIAGNOSIS — E113413 Type 2 diabetes mellitus with severe nonproliferative diabetic retinopathy with macular edema, bilateral: Secondary | ICD-10-CM | POA: Diagnosis not present

## 2023-06-16 DIAGNOSIS — H35033 Hypertensive retinopathy, bilateral: Secondary | ICD-10-CM | POA: Diagnosis not present

## 2023-06-17 ENCOUNTER — Encounter (HOSPITAL_COMMUNITY): Payer: BC Managed Care – PPO

## 2023-06-20 ENCOUNTER — Telehealth: Payer: Self-pay | Admitting: Pharmacist

## 2023-06-20 DIAGNOSIS — E785 Hyperlipidemia, unspecified: Secondary | ICD-10-CM

## 2023-06-20 NOTE — Telephone Encounter (Signed)
Repatha added to max tolerated statin mid Aug. Call patient to go for follow up lab.  Patient will be going on Wed Nov 6 at [facility] office

## 2023-06-21 LAB — LIPID PANEL
Chol/HDL Ratio: 1.9 ratio (ref 0.0–5.0)
Cholesterol, Total: 60 mg/dL — ABNORMAL LOW (ref 100–199)
HDL: 32 mg/dL — ABNORMAL LOW (ref >39)
LDL Chol Calc (NIH): 10 mg/dL (ref 0–99)
Triglycerides: 88 mg/dL (ref 0–149)
VLDL Cholesterol Cal: 18 mg/dL (ref 5–40)

## 2023-06-22 MED ORDER — ATORVASTATIN CALCIUM 40 MG PO TABS: 40.0000 mg | ORAL_TABLET | Freq: Every day | ORAL | 3 refills | Status: DC

## 2023-06-22 NOTE — Telephone Encounter (Signed)
Lipid lab discussed, LDL 10 on Lipitor 80 mg and Repatha - advised patient to lower atorvastatin to 40 mg daily and continue with Repatha 140 mg every 14 days

## 2023-06-22 NOTE — Addendum Note (Signed)
Addended by: Tylene Fantasia on: 06/22/2023 01:30 PM   Modules accepted: Orders

## 2023-06-24 ENCOUNTER — Telehealth: Payer: Self-pay | Admitting: *Deleted

## 2023-06-24 NOTE — Telephone Encounter (Signed)
Left message for patient to call back   Need to reduce Atorvastatin dosage to 20 mg daily per Dr Herbie Baltimore.

## 2023-06-24 NOTE — Telephone Encounter (Signed)
-----   Message from Bryan Lemma sent at 06/24/2023  3:25 PM EST ----- Cholesterol levels look great.  LDL went down from 73-10 and the total cholesterol went down from 128-60. These results show that Repatha is doing his job. At this level I think were probably safe reducing the atorvastatin to 1/2 tablet daily.  Bryan Lemma

## 2023-06-27 MED ORDER — ATORVASTATIN CALCIUM 20 MG PO TABS: 20.0000 mg | ORAL_TABLET | Freq: Every day | ORAL | 3 refills | Status: DC

## 2023-06-27 NOTE — Telephone Encounter (Signed)
Patient identification verified by 2 forms. Daniel Rail, RN   Called and spoke to patient  Relayed result message  Informed patient new Rx for Atorvastatin 20mg  sent to pharmacy  Patient verbalized understanding, no questions at this time

## 2023-06-27 NOTE — Telephone Encounter (Signed)
Patient is returning call regarding lab results. Please call.

## 2023-06-30 ENCOUNTER — Encounter (HOSPITAL_COMMUNITY): Payer: BC Managed Care – PPO

## 2023-07-04 ENCOUNTER — Telehealth: Payer: Self-pay | Admitting: Pharmacist

## 2023-07-04 NOTE — Telephone Encounter (Signed)
Call to see how is patient doing on Mounjaro 10 mg dose, N/A LVM

## 2023-07-04 NOTE — Telephone Encounter (Signed)
Patient reports his appetite is so low and food does not taste that great. He has not used insulin since he is on [place]. Will calculate daily calories intake for 2 days and hid BG will follow up in 2 days.

## 2023-07-06 NOTE — Progress Notes (Signed)
ADVANCED HF CLINIC PROGRESS NOTE  Primary Care: Pcp, No HF Cardiologist: Dr. Shirlee Latch   62 y.o. male smoker w/ h/o HLD, Type 2DM and CAD with systolic heart failure/ ischemic CMP.    Admitted 3/24 with acute inferior STEMI. Cath with 55% prox RCA, 100% thrombotic occluded mid RCA (culprit), s/p PCI +DES  to prox-mid RCA, residual 60% stenosis of distal RCA, 80% mid 1st diag (small vessel) and 70% prox 1st OM (small vessel) treated medically. LVEDP was 14 mmHg. Echo showed EF, 35-40%, GIIDD, interventricular septum flattened in diastole ('D' shaped left ventricle), consistent with right ventricular volume overload, RV systolic fx mildly reduced. Suspected RV infarction.  Treated w/ ASA, Brilinta + atorvastatin. Entresto, spironolactone and Toprol XL for GDMT. Was not discharged on loop diuretic. Referred to [facility] clinic. D/c wt 170 lb.    Seen in [facility] 4/24 for post hospital follow up. NYHA II with mild volume overload. Farxiga added and referred to AHF clinic.  Echo 7/24 EF 55-60%, basal inferior hypokinesis, mildly decreased RV systolic function, normal IVC.   Patient returns for followup of CHF and CAD.  He has not had any chest pain.  He is occasionally short of breath walking fast up stairs.  He is able to mow grass and do other yardwork without much problem.  He is staying off cigarettes. Weight stable. No orthopnea/PND.   ECG (personally reviewed): ***   Labs (4/24): K 4.4, creatinine 0.96, LDL 154, Lp (a) 213, A1C 11.7  Labs (5/24): K 4.1, creatinine 1.04, LDL 73 Labs 97/24): K 4.1, creatinine 1.04   PMH: 1. Prior smoker 2. Hyperlipidemia 3. Type 2 diabetes 4. CAD: Acute inferior MI complicated by RV infarction in 3/24. Cath showed 55% prox RCA, 100% thrombotic occluded mid RCA (culprit), s/p PCI +DES  to prox-mid RCA, residual 60% stenosis of distal RCA, 80% mid 1st diag (small vessel) and 70% prox 1st OM (small vessel) treated medically. LVEDP was 14 mmHg 5. Chronic systolic CHF:  Ischemic cardiomyopathy. Echo (3/24): EF 35-40%, grade II DD, RV mildly reduced. - Echo (7/24): EF 55-60%, basal inferior hypokinesis, mildly decreased RV systolic function, normal IVC.    Review of Systems: All systems reviewed and negative except as per HPI.    Current Outpatient Medications  Medication Sig Dispense Refill   Accu-Chek Softclix Lancets lancets Use as directed 100 each 0   aspirin EC 81 MG tablet Take 1 tablet (81 mg total) by mouth daily. Swallow whole. 90 tablet 3   atorvastatin (LIPITOR) 20 MG tablet Take 1 tablet (20 mg total) by mouth daily. 90 tablet 3   blood glucose meter kit and supplies Dispense based on patient and insurance preference. Use up to four times daily as directed. (FOR ICD-10 E10.9, E11.9). 1 each 0   Blood Glucose Monitoring Suppl KIT 1 Units by Does not apply route daily. 1 kit 0   dapagliflozin propanediol (FARXIGA) 10 MG TABS tablet Take 1 tablet (10 mg total) by mouth daily before breakfast. 30 tablet 11   Evolocumab (REPATHA SURECLICK) 140 MG/ML SOAJ Inject 140 mg into the skin every 14 (fourteen) days. 2 mL 11   glucose blood (ONETOUCH VERIO) test strip Use as instructed 100 each 12   Insulin Pen Needle 32G X 4 MM MISC Use daily 100 each 3   metFORMIN (GLUCOPHAGE) 500 MG tablet Take 1 tablet (500 mg total) by mouth daily with breakfast. 30 tablet 11   metoprolol succinate (TOPROL XL) 50 MG 24 hr tablet  Take 1 tablet (50 mg total) by mouth daily. Take with or immediately following a meal. 90 tablet 3   nitroGLYCERIN (NITROSTAT) 0.4 MG SL tablet Place 1 tablet (0.4 mg total) under the tongue every 5 (five) minutes x 3 doses as needed for chest pain. 25 tablet 0   sacubitril-valsartan (ENTRESTO) 49-51 MG Take 1 tablet by mouth 2 (two) times daily. 180 tablet 3   spironolactone (ALDACTONE) 25 MG tablet Take 1 tablet (25 mg total) by mouth daily. 90 tablet 3   ticagrelor (BRILINTA) 90 MG TABS tablet Take 1 tablet (90 mg total) by mouth 2 (two) times  daily. 180 tablet 1   tirzepatide (MOUNJARO) 10 MG/0.5ML Pen Inject 10 mg into the skin once a week. 2 mL 0   No current facility-administered medications for this visit.    No Known Allergies    Social History   Socioeconomic History   Marital status: Married    Spouse name: Daniel Bautista   Number of children: 3   Years of education: Not on file   Highest education level: High school graduate  Occupational History   Occupation: Retired  Tobacco Use   Smoking status: Former    Current packs/day: 0.00    Types: Cigarettes    Start date: 11/01/1977    Quit date: 11/02/2022    Years since quitting: 0.6    Passive exposure: Never   Smokeless tobacco: Never  Vaping Use   Vaping status: Never Used  Substance and Sexual Activity   Alcohol use: Yes    Alcohol/week: 12.0 standard drinks of alcohol    Types: 12 Cans of beer per week    Comment: 12 pack per month   Drug use: Never   Sexual activity: Not on file  Other Topics Concern   Not on file  Social History Narrative   Not on file   Social Determinants of Health   Financial Resource Strain: Low Risk  (11/04/2022)   Overall Financial Resource Strain (CARDIA)    Difficulty of Paying Living Expenses: Not hard at all  Food Insecurity: No Food Insecurity (11/04/2022)   Hunger Vital Sign    Worried About Running Out of Food in the Last Year: Never true    Ran Out of Food in the Last Year: Never true  Transportation Needs: No Transportation Needs (11/04/2022)   PRAPARE - Administrator, Civil Service (Medical): No    Lack of Transportation (Non-Medical): No  Physical Activity: Not on file  Stress: Not on file  Social Connections: Not on file  Intimate Partner Violence: Not on file   FH: Father had MI.   There were no vitals taken for this visit.  Wt Readings from Last 3 Encounters:  05/20/23 77.7 kg (171 lb 6.4 oz)  03/25/23 81.4 kg (179 lb 6.4 oz)  03/15/23 80.2 kg (176 lb 14.4 oz)   PHYSICAL EXAM: General:   Well appearing. No respiratory difficulty HEENT: normal Neck: supple. no JVD. Carotids 2+ bilat; no bruits. No lymphadenopathy or thyromegaly appreciated. Cor: PMI nondisplaced. Regular rate & rhythm. No rubs, gallops or murmurs. Lungs: clear Abdomen: soft, nontender, nondistended. No hepatosplenomegaly. No bruits or masses. Good bowel sounds. Extremities: no cyanosis, clubbing, rash, edema Neuro: alert & oriented x 3, cranial nerves grossly intact. moves all 4 extremities w/o difficulty. Affect pleasant.   ASSESSMENT & PLAN: 1. Chronic systolic CHF: Ischemic cardiomyopathy.  Inferior STEMI complicated by RV infarction in 3/24 due to occluded mRCA, s/p PCI.  Echo  in 3/24 with EF 35-40%, RV mildly reduced. Echo in 7/24 with EF improved to 55-60%, basal inferior hypokinesis, mildly decreased RV systolic function, normal IVC. NYHA class II symptoms, not volume overloaded on exam.  - Continue Farxiga 10 mg daily.  - Continue spironolactone 25 mg daily  - Continue Entresto 49/51 mg bid.  - Continue Toprol XL 50 mg daily.  - Does not need loop diuretic currently  - Check BMP and BNP today  2. CAD: s/p inferior STEMI w/ stent to prox-mid RCA; residual disease in distal RCA, 1st diag and 1st OM (medical management, vessels too small for stenting). No chest pain. - Continue ASA + Brilinta. - Continue atorvastatin.  - Refer to cardiac re[facility] at [facility] 3. Hyperlipidemia: Goal LDL < 55, Lp(a) elevated at 213 - on atorvastatin.  - Now on Repatha. Followed by Lipid clinic. Recent LP 11/24 w/ LDL down to 10 mg/dL 4. Tobacco Abuse: has quit smoking since 3/24.  - Congratulated on efforts   F/u w/ Dr. Shirlee Latch in 4 months   Daniel Lis, PA-C  07/06/23

## 2023-07-07 ENCOUNTER — Encounter (HOSPITAL_COMMUNITY): Payer: Self-pay

## 2023-07-07 ENCOUNTER — Ambulatory Visit (HOSPITAL_COMMUNITY)
Admission: RE | Admit: 2023-07-07 | Discharge: 2023-07-07 | Disposition: A | Discharge status: Home / Self Care | Payer: BC Managed Care – PPO | Source: Ambulatory Visit | Attending: Cardiology | Admitting: Cardiology

## 2023-07-07 ENCOUNTER — Telehealth (HOSPITAL_COMMUNITY): Payer: Self-pay | Admitting: *Deleted

## 2023-07-07 VITALS — BP 92/64 | HR 77 | Wt 160.8 lb

## 2023-07-07 DIAGNOSIS — I5022 Chronic systolic (congestive) heart failure: Secondary | ICD-10-CM | POA: Insufficient documentation

## 2023-07-07 DIAGNOSIS — Z87891 Personal history of nicotine dependence: Secondary | ICD-10-CM | POA: Diagnosis not present

## 2023-07-07 DIAGNOSIS — Z7984 Long term (current) use of oral hypoglycemic drugs: Secondary | ICD-10-CM | POA: Insufficient documentation

## 2023-07-07 DIAGNOSIS — E1165 Type 2 diabetes mellitus with hyperglycemia: Secondary | ICD-10-CM | POA: Diagnosis not present

## 2023-07-07 DIAGNOSIS — E119 Type 2 diabetes mellitus without complications: Secondary | ICD-10-CM | POA: Diagnosis not present

## 2023-07-07 DIAGNOSIS — I252 Old myocardial infarction: Secondary | ICD-10-CM | POA: Insufficient documentation

## 2023-07-07 DIAGNOSIS — Z79899 Other long term (current) drug therapy: Secondary | ICD-10-CM | POA: Diagnosis not present

## 2023-07-07 DIAGNOSIS — E785 Hyperlipidemia, unspecified: Secondary | ICD-10-CM | POA: Insufficient documentation

## 2023-07-07 DIAGNOSIS — Z7982 Long term (current) use of aspirin: Secondary | ICD-10-CM | POA: Diagnosis not present

## 2023-07-07 DIAGNOSIS — Z7902 Long term (current) use of antithrombotics/antiplatelets: Secondary | ICD-10-CM | POA: Insufficient documentation

## 2023-07-07 DIAGNOSIS — I2510 Atherosclerotic heart disease of native coronary artery without angina pectoris: Secondary | ICD-10-CM | POA: Insufficient documentation

## 2023-07-07 DIAGNOSIS — I255 Ischemic cardiomyopathy: Secondary | ICD-10-CM | POA: Diagnosis not present

## 2023-07-07 LAB — COMPREHENSIVE METABOLIC PANEL
ALT: 26 U/L (ref 0–44)
AST: 22 U/L (ref 15–41)
Albumin: 3.9 g/dL (ref 3.5–5.0)
Alkaline Phosphatase: 61 U/L (ref 38–126)
Anion gap: 6 (ref 5–15)
BUN: 10 mg/dL (ref 8–23)
CO2: 24 mmol/L (ref 22–32)
Calcium: 9.4 mg/dL (ref 8.9–10.3)
Chloride: 104 mmol/L (ref 98–111)
Creatinine, Ser: 1.1 mg/dL (ref 0.61–1.24)
GFR, Estimated: >60 mL/min (ref >60)
Glucose, Bld: 88 mg/dL (ref 70–99)
Potassium: 4.2 mmol/L (ref 3.5–5.1)
Sodium: 134 mmol/L — ABNORMAL LOW (ref 135–145)
Total Bilirubin: 0.8 mg/dL (ref <1.2)
Total Protein: 7.3 g/dL (ref 6.5–8.1)

## 2023-07-07 LAB — CBC
HCT: 45 % (ref 39.0–52.0)
Hemoglobin: 15 g/dL (ref 13.0–17.0)
MCH: 27.6 pg (ref 26.0–34.0)
MCHC: 33.3 g/dL (ref 30.0–36.0)
MCV: 82.9 fL (ref 80.0–100.0)
Platelets: 294 10*3/uL (ref 150–400)
RBC: 5.43 MIL/uL (ref 4.22–5.81)
RDW: 13.4 % (ref 11.5–15.5)
WBC: 7.7 10*3/uL (ref 4.0–10.5)
nRBC: 0 % (ref 0.0–0.2)

## 2023-07-07 LAB — TSH: TSH: 1.042 u[IU]/mL (ref 0.350–4.500)

## 2023-07-07 LAB — HEMOGLOBIN A1C
Hgb A1c MFr Bld: 7.1 % — ABNORMAL HIGH (ref 4.8–5.6)
Mean Plasma Glucose: 157.07 mg/dL

## 2023-07-07 LAB — BRAIN NATRIURETIC PEPTIDE: B Natriuretic Peptide: 7.6 pg/mL (ref 0.0–100.0)

## 2023-07-07 MED ORDER — METOPROLOL SUCCINATE ER 25 MG PO TB24: 25.0000 mg | ORAL_TABLET | Freq: Every day | ORAL | 3 refills | Status: DC

## 2023-07-07 NOTE — Progress Notes (Signed)
ReDS Vest / Clip - 07/07/23 1000       ReDS Vest / Clip   Station Marker C    Ruler Value 24    ReDS Value Range Moderate volume overload    ReDS Actual Value 38

## 2023-07-07 NOTE — Telephone Encounter (Signed)
Fluid Marker (BNP) is completely normal. No need to start any additional diuretic medications. Continue current HF meds. All other labs are ok/stable and Hgb A1c is much improved, down from 11.7>>7.1, indicating that his DM is under better control. Can cancel the f/u w/ APP in 4-6 wks. F/u Dr. Shirlee Latch in 4-6 months

## 2023-07-07 NOTE — Patient Instructions (Signed)
Decrease Toprol XL to 25 mg daily - updated Rx sent to your pharmacy. Labs today - we will call you if abnormal. Return to Heart Failure APP Clinic in 4 - 6 weeks. See below. Return to see Dr. Shirlee Latch in 4 - 6 months. Please call us at [phone] in March to schedule this appointment.  Please call us at [phone] if any questions or concerns prior to your next visit.

## 2023-07-22 ENCOUNTER — Other Ambulatory Visit: Payer: Self-pay | Admitting: Nurse Practitioner

## 2023-08-12 ENCOUNTER — Encounter (HOSPITAL_COMMUNITY): Payer: BC Managed Care – PPO

## 2023-08-30 ENCOUNTER — Other Ambulatory Visit: Payer: Self-pay | Admitting: Cardiology

## 2023-08-31 DIAGNOSIS — H43823 Vitreomacular adhesion, bilateral: Secondary | ICD-10-CM | POA: Diagnosis not present

## 2023-08-31 DIAGNOSIS — E113413 Type 2 diabetes mellitus with severe nonproliferative diabetic retinopathy with macular edema, bilateral: Secondary | ICD-10-CM | POA: Diagnosis not present

## 2023-08-31 DIAGNOSIS — H35033 Hypertensive retinopathy, bilateral: Secondary | ICD-10-CM | POA: Diagnosis not present

## 2023-08-31 NOTE — Telephone Encounter (Signed)
 Spoke to patient, would like to stay on 10 mg dose for Mounjaro . As BG is well controlled and he is able to tolerate that dose.

## 2023-09-15 ENCOUNTER — Ambulatory Visit: Payer: BC Managed Care – PPO | Admitting: Family Medicine

## 2023-10-02 ENCOUNTER — Other Ambulatory Visit: Payer: Self-pay | Admitting: Cardiology

## 2023-10-10 ENCOUNTER — Encounter: Payer: Self-pay | Admitting: Family Medicine

## 2023-10-10 ENCOUNTER — Ambulatory Visit: Payer: BC Managed Care – PPO | Admitting: Family Medicine

## 2023-10-10 VITALS — BP 124/76 | Temp 97.2°F | Ht 68.25 in | Wt 167.0 lb

## 2023-10-10 DIAGNOSIS — Z7984 Long term (current) use of oral hypoglycemic drugs: Secondary | ICD-10-CM

## 2023-10-10 DIAGNOSIS — E1169 Type 2 diabetes mellitus with other specified complication: Secondary | ICD-10-CM

## 2023-10-10 DIAGNOSIS — I25110 Atherosclerotic heart disease of native coronary artery with unstable angina pectoris: Secondary | ICD-10-CM

## 2023-10-10 DIAGNOSIS — E118 Type 2 diabetes mellitus with unspecified complications: Secondary | ICD-10-CM

## 2023-10-10 DIAGNOSIS — E785 Hyperlipidemia, unspecified: Secondary | ICD-10-CM

## 2023-10-10 DIAGNOSIS — I5022 Chronic systolic (congestive) heart failure: Secondary | ICD-10-CM

## 2023-10-10 DIAGNOSIS — Z1211 Encounter for screening for malignant neoplasm of colon: Secondary | ICD-10-CM

## 2023-10-10 MED ORDER — TIRZEPATIDE 7.5 MG/0.5ML ~~LOC~~ SOAJ: 7.5000 mg | SUBCUTANEOUS | 1 refills | Status: DC

## 2023-10-10 NOTE — Assessment & Plan Note (Signed)
 Stable.  Continue current medications.

## 2023-10-10 NOTE — Patient Instructions (Addendum)
 Labs today.   Mounjaro decreased.  Follow up in 3 months.

## 2023-10-10 NOTE — Progress Notes (Signed)
 Subjective:  Patient ID: Daniel Bautista, male    DOB: 1960-09-23  Age: 63 y.o. MRN: [id number]  CC:   Chief Complaint  Patient presents with   Establish Care    Loss of appetite, dizziness, blurry vision    HPI:  63 year old male with CAD, systolic heart failure, type 2 diabetes, hyperlipidemia, former smoker presents to establish care.  Patient follows closely with cardiology.  EF has improved.  He has baseline shortness of breath but no chest pain.  He is on aspirin, Brilinta, Farxiga, metoprolol, Entresto, spironolactone.  Patient states that he feels that he is experiencing adverse effects from [facility].  Currently on 10 mg dose.  Experiencing dizziness, decreased appetite, and some vision changes.  He would like to discuss this today.  Patient's lipids at goal on atorvastatin and Repatha.  Patient well overdue for many preventative healthcare items.  Declines vaccines today.  States that he has had a recent eye exam.  Needs colonoscopy.  He is amenable to this.    Patient Active Problem List   Diagnosis Date Noted   Overweight (BMI 25.0-29.9) 03/25/2023   Chronic systolic heart failure (HCC) 12/09/2022   Hyperlipidemia associated with type 2 diabetes mellitus (HCC) 11/03/2022   Coronary artery disease involving native coronary artery of native heart with unstable angina pectoris (HCC) 11/03/2022   Presence of drug coated stent in right coronary artery 11/03/2022   Ischemic cardiomyopathy 11/03/2022   Type 2 diabetes mellitus with complication, without long-term current use of insulin (HCC) 11/03/2022    Social Hx   Social History   Socioeconomic History   Marital status: Married    Spouse name: [name]   Number of children: 3   Years of education: Not on file   Highest education level: High school graduate  Occupational History   Occupation: Retired  Tobacco Use   Smoking status: Former    Current packs/day: 0.00    Types: Cigarettes    Start date: 11/01/1977     Quit date: 11/02/2022    Years since quitting: 0.9    Passive exposure: Never   Smokeless tobacco: Never  Vaping Use   Vaping status: Never Used  Substance and Sexual Activity   Alcohol use: Yes    Alcohol/week: 12.0 standard drinks of alcohol    Types: 12 Cans of beer per week    Comment: 12 pack per month   Drug use: Never   Sexual activity: Not on file  Other Topics Concern   Not on file  Social History Narrative   Not on file   Social Drivers of Health   Financial Resource Strain: Low Risk  (11/04/2022)   Overall Financial Resource Strain (CARDIA)    Difficulty of Paying Living Expenses: Not hard at all  Food Insecurity: No Food Insecurity (11/04/2022)   Hunger Vital Sign    Worried About Running Out of Food in the Last Year: Never true    Ran Out of Food in the Last Year: Never true  Transportation Needs: No Transportation Needs (11/04/2022)   PRAPARE - Administrator, Civil Service (Medical): No    Lack of Transportation (Non-Medical): No  Physical Activity: Not on file  Stress: Not on file  Social Connections: Not on file    Review of Systems Per HPI  Objective:  BP 124/76   Temp (!) 97.2 F (36.2 C)   Ht 5' 8.25" (1.734 m)   Wt 167 lb (75.8 kg)   SpO2 97%  BMI 25.21 kg/m      10/10/2023    1:59 PM 07/07/2023   10:02 AM 05/20/2023    9:45 AM  BP/Weight  Systolic BP 124 92   Diastolic BP 76 64   Wt. (Lbs) 167 160.8 171.4  BMI 25.21 kg/m2 24.27 kg/m2 25.87 kg/m2    Physical Exam Vitals and nursing note reviewed.  Constitutional:      General: He is not in acute distress.    Appearance: Normal appearance.  HENT:     Head: Normocephalic and atraumatic.  Eyes:     General:        Right eye: No discharge.        Left eye: No discharge.     Conjunctiva/sclera: Conjunctivae normal.  Cardiovascular:     Rate and Rhythm: Normal rate and regular rhythm.  Pulmonary:     Effort: Pulmonary effort is normal.     Breath sounds: Normal  breath sounds. No wheezing, rhonchi or rales.  Feet:     Comments: Diabetic Foot Check -  Appearance - no lesions, ulcers or calluses Skin - no unusual pallor or redness Monofilament testing -  Right - Great toe, medial, central, lateral ball and posterior foot intact Left - Great toe, medial, central, lateral ball and posterior foot intact  Neurological:     Mental Status: He is alert.  Psychiatric:        Mood and Affect: Mood normal.        Behavior: Behavior normal.     Lab Results  Component Value Date   WBC 7.7 07/07/2023   HGB 15.0 07/07/2023   HCT 45.0 07/07/2023   PLT 294 07/07/2023   GLUCOSE 88 07/07/2023   CHOL 60 (L) 06/21/2023   TRIG 88 06/21/2023   HDL 32 (L) 06/21/2023   LDLCALC 10 06/21/2023   ALT 26 07/07/2023   AST 22 07/07/2023   NA 134 (L) 07/07/2023   K 4.2 07/07/2023   CL 104 07/07/2023   CREATININE 1.10 07/07/2023   BUN 10 07/07/2023   CO2 24 07/07/2023   TSH 1.042 07/07/2023   INR 1.0 11/02/2022   HGBA1C 7.1 (H) 07/07/2023     Assessment & Plan:  Type 2 diabetes mellitus with complication, without long-term current use of insulin (HCC) Assessment & Plan: Experiencing side effects from [facility].  Decreasing dose.  Labs today.  Orders: -     Microalbumin / creatinine urine ratio -     Hemoglobin A1c -     Tirzepatide; Inject 7.5 mg into the skin once a week.  Dispense: 6 mL; Refill: 1  Coronary artery disease involving native coronary artery of native heart with unstable angina pectoris (HCC) Assessment & Plan: Stable.  Continue current medications.   Screening for colon cancer -     Ambulatory referral to Gastroenterology  Chronic systolic heart failure [facility]) Assessment & Plan: EF has recovered.  Continue current medications.  Continue close follow-up with cardiology.   Hyperlipidemia associated with type 2 diabetes mellitus (HCC) Assessment & Plan: At goal on statin and Repatha.  Continue.     Follow-up:  3  months  Shanetta Nicolls Adriana Simas DO [facility]

## 2023-10-10 NOTE — Assessment & Plan Note (Signed)
 Experiencing side effects from [facility].  Decreasing dose.  Labs today.

## 2023-10-10 NOTE — Assessment & Plan Note (Signed)
 EF has recovered.  Continue current medications.  Continue close follow-up with cardiology.

## 2023-10-10 NOTE — Assessment & Plan Note (Signed)
 At goal on statin and Repatha.  Continue.

## 2023-10-11 ENCOUNTER — Encounter (INDEPENDENT_AMBULATORY_CARE_PROVIDER_SITE_OTHER): Payer: Self-pay | Admitting: *Deleted

## 2023-10-11 LAB — MICROALBUMIN / CREATININE URINE RATIO
Creatinine, Urine: 54.8 mg/dL
Microalb/Creat Ratio: 18 mg/g{creat} (ref 0–29)
Microalbumin, Urine: 10 ug/mL

## 2023-10-11 LAB — HEMOGLOBIN A1C
Est. average glucose Bld gHb Est-mCnc: 160 mg/dL
Hgb A1c MFr Bld: 7.2 % — ABNORMAL HIGH (ref 4.8–5.6)

## 2023-10-16 ENCOUNTER — Encounter: Payer: Self-pay | Admitting: Family Medicine

## 2023-10-20 ENCOUNTER — Other Ambulatory Visit: Payer: Self-pay | Admitting: Cardiology

## 2023-10-21 NOTE — Telephone Encounter (Signed)
 This is a CHF pt

## 2023-10-26 DIAGNOSIS — H43823 Vitreomacular adhesion, bilateral: Secondary | ICD-10-CM | POA: Diagnosis not present

## 2023-10-26 DIAGNOSIS — E113411 Type 2 diabetes mellitus with severe nonproliferative diabetic retinopathy with macular edema, right eye: Secondary | ICD-10-CM | POA: Diagnosis not present

## 2023-10-26 DIAGNOSIS — H35033 Hypertensive retinopathy, bilateral: Secondary | ICD-10-CM | POA: Diagnosis not present

## 2023-10-30 ENCOUNTER — Other Ambulatory Visit (HOSPITAL_COMMUNITY): Payer: Self-pay | Admitting: Cardiology

## 2023-10-31 ENCOUNTER — Other Ambulatory Visit: Payer: Self-pay | Admitting: Cardiology

## 2023-11-01 ENCOUNTER — Other Ambulatory Visit: Payer: Self-pay

## 2023-11-01 MED ORDER — METFORMIN HCL 500 MG PO TABS: 500.0000 mg | ORAL_TABLET | Freq: Every day | ORAL | 1 refills | Status: DC

## 2023-12-08 DIAGNOSIS — H35033 Hypertensive retinopathy, bilateral: Secondary | ICD-10-CM | POA: Diagnosis not present

## 2023-12-08 DIAGNOSIS — E113411 Type 2 diabetes mellitus with severe nonproliferative diabetic retinopathy with macular edema, right eye: Secondary | ICD-10-CM | POA: Diagnosis not present

## 2023-12-08 DIAGNOSIS — H43823 Vitreomacular adhesion, bilateral: Secondary | ICD-10-CM | POA: Diagnosis not present

## 2023-12-15 ENCOUNTER — Other Ambulatory Visit (HOSPITAL_COMMUNITY): Payer: Self-pay | Admitting: Cardiology

## 2024-01-06 DIAGNOSIS — E113413 Type 2 diabetes mellitus with severe nonproliferative diabetic retinopathy with macular edema, bilateral: Secondary | ICD-10-CM | POA: Diagnosis not present

## 2024-01-12 ENCOUNTER — Ambulatory Visit: Payer: BC Managed Care – PPO | Admitting: Family Medicine

## 2024-01-12 VITALS — BP 109/71 | HR 69 | Temp 97.3°F | Ht 68.25 in | Wt 165.0 lb

## 2024-01-12 DIAGNOSIS — I5022 Chronic systolic (congestive) heart failure: Secondary | ICD-10-CM

## 2024-01-12 DIAGNOSIS — E1169 Type 2 diabetes mellitus with other specified complication: Secondary | ICD-10-CM

## 2024-01-12 DIAGNOSIS — E118 Type 2 diabetes mellitus with unspecified complications: Secondary | ICD-10-CM

## 2024-01-12 DIAGNOSIS — E785 Hyperlipidemia, unspecified: Secondary | ICD-10-CM | POA: Diagnosis not present

## 2024-01-12 DIAGNOSIS — Z7984 Long term (current) use of oral hypoglycemic drugs: Secondary | ICD-10-CM

## 2024-01-12 MED ORDER — REPATHA SURECLICK 140 MG/ML ~~LOC~~ SOAJ: 1.0000 mL | SUBCUTANEOUS | 3 refills | Status: AC

## 2024-01-12 MED ORDER — TIRZEPATIDE 7.5 MG/0.5ML ~~LOC~~ SOAJ: 7.5000 mg | SUBCUTANEOUS | 1 refills | Status: AC

## 2024-01-12 NOTE — Assessment & Plan Note (Signed)
 LDL has been at goal.  However, he has not recently been taking his Repatha .  Medication was refilled.  Lipid panel today.

## 2024-01-12 NOTE — Assessment & Plan Note (Signed)
 Last A1c was 7.2.  Needs A1c today.  A1c ordered.  Mounjaro  refilled.

## 2024-01-12 NOTE — Patient Instructions (Signed)
 Labs today.   Follow up in 3 months.

## 2024-01-12 NOTE — Progress Notes (Signed)
 Subjective:  Patient ID: Daniel Bautista, male    DOB: Jul 21, 1961  Age: 63 y.o. MRN: [id number]  CC:   Chief Complaint  Patient presents with   3 month follow up     No concerns voiced     HPI:  63 year old male with below mentioned medical problems presents for follow-up.  Patient reports that he is overall doing well.  He does report a decrease in appetite associated with Mounjaro .  Needs labs including A1c today.  Blood pressure well-controlled.  No chest pain or shortness of breath.  No reports of lower extremity edema.  Last echocardiogram revealed that his EF was back to normal.  Most recent blood sugar was 119.  He is taking metformin , Mounjaro , and Farxiga .  Patient Active Problem List   Diagnosis Date Noted   Chronic systolic heart failure (HCC) 12/09/2022   Hyperlipidemia associated with type 2 diabetes mellitus (HCC) 11/03/2022   Coronary artery disease involving native coronary artery of native heart with unstable angina pectoris (HCC) 11/03/2022   Presence of drug coated stent in right coronary artery 11/03/2022   Ischemic cardiomyopathy 11/03/2022   Type 2 diabetes mellitus with complication, without long-term current use of insulin  (HCC) 11/03/2022    Social Hx   Social History   Socioeconomic History   Marital status: Married    Spouse name: Shelvy Dickens   Number of children: 3   Years of education: Not on file   Highest education level: High school graduate  Occupational History   Occupation: Retired  Tobacco Use   Smoking status: Former    Current packs/day: 0.00    Types: Cigarettes    Start date: 11/01/1977    Quit date: 11/02/2022    Years since quitting: 1.1    Passive exposure: Never   Smokeless tobacco: Never  Vaping Use   Vaping status: Never Used  Substance and Sexual Activity   Alcohol use: Yes    Alcohol/week: 12.0 standard drinks of alcohol    Types: 12 Cans of beer per week    Comment: 12 pack per month   Drug use: Never   Sexual  activity: Not on file  Other Topics Concern   Not on file  Social History Narrative   Not on file   Social Drivers of Health   Financial Resource Strain: Low Risk  (11/04/2022)   Overall Financial Resource Strain (CARDIA)    Difficulty of Paying Living Expenses: Not hard at all  Food Insecurity: No Food Insecurity (11/04/2022)   Hunger Vital Sign    Worried About Running Out of Food in the Last Year: Never true    Ran Out of Food in the Last Year: Never true  Transportation Needs: No Transportation Needs (11/04/2022)   PRAPARE - Administrator, Civil Service (Medical): No    Lack of Transportation (Non-Medical): No  Physical Activity: Not on file  Stress: Not on file  Social Connections: Not on file    Review of Systems Per HPI  Objective:  BP 109/71   Pulse 69   Temp (!) 97.3 F (36.3 C)   Ht 5' 8.25" (1.734 m)   Wt 165 lb (74.8 kg)   SpO2 99%   BMI 24.90 kg/m      01/12/2024    8:59 AM 10/10/2023    1:59 PM 07/07/2023   10:02 AM  BP/Weight  Systolic BP 109 124 92  Diastolic BP 71 76 64  Wt. (Lbs) 165 167 160.8  BMI  24.9 kg/m2 25.21 kg/m2 24.27 kg/m2    Physical Exam Vitals and nursing note reviewed.  Constitutional:      General: He is not in acute distress.    Appearance: Normal appearance.  HENT:     Head: Normocephalic and atraumatic.  Eyes:     General:        Right eye: No discharge.        Left eye: No discharge.     Conjunctiva/sclera: Conjunctivae normal.  Cardiovascular:     Rate and Rhythm: Normal rate and regular rhythm.  Pulmonary:     Effort: Pulmonary effort is normal.     Breath sounds: Normal breath sounds. No wheezing, rhonchi or rales.  Neurological:     Mental Status: He is alert.  Psychiatric:        Mood and Affect: Mood normal.        Behavior: Behavior normal.     Lab Results  Component Value Date   WBC 7.7 07/07/2023   HGB 15.0 07/07/2023   HCT 45.0 07/07/2023   PLT 294 07/07/2023   GLUCOSE 88 07/07/2023    CHOL 60 (L) 06/21/2023   TRIG 88 06/21/2023   HDL 32 (L) 06/21/2023   LDLCALC 10 06/21/2023   ALT 26 07/07/2023   AST 22 07/07/2023   NA 134 (L) 07/07/2023   K 4.2 07/07/2023   CL 104 07/07/2023   CREATININE 1.10 07/07/2023   BUN 10 07/07/2023   CO2 24 07/07/2023   TSH 1.042 07/07/2023   INR 1.0 11/02/2022   HGBA1C 7.2 (H) 10/10/2023     Assessment & Plan:  Type 2 diabetes mellitus with complication, without long-term current use of insulin  (HCC) Assessment & Plan: Last A1c was 7.2.  Needs A1c today.  A1c ordered.  Mounjaro  refilled.  Orders: -     Tirzepatide ; Inject 7.5 mg into the skin once a week.  Dispense: 6 mL; Refill: 1 -     Hemoglobin A1c  Hyperlipidemia associated with type 2 diabetes mellitus (HCC) Assessment & Plan: LDL has been at goal.  However, he has not recently been taking his Repatha .  Medication was refilled.  Lipid panel today.  Orders: -     Repatha  SureClick; Inject 140 mg into the skin every 14 (fourteen) days.  Dispense: 6 mL; Refill: 3 -     Lipid panel  Chronic systolic heart failure (HCC) Assessment & Plan: EF recovered/improved.  Stable.  Euvolemic today.  Continue current medications.     Follow-up:  Return in about 3 months (around 04/13/2024) for Follow up Chronic medical issues.  Kathleen Papa DO [facility]

## 2024-01-12 NOTE — Assessment & Plan Note (Signed)
 EF recovered/improved.  Stable.  Euvolemic today.  Continue current medications.

## 2024-01-13 LAB — LIPID PANEL
Chol/HDL Ratio: 3.5 ratio (ref 0.0–5.0)
Cholesterol, Total: 112 mg/dL (ref 100–199)
HDL: 32 mg/dL — ABNORMAL LOW (ref >39)
LDL Chol Calc (NIH): 56 mg/dL (ref 0–99)
Triglycerides: 135 mg/dL (ref 0–149)
VLDL Cholesterol Cal: 24 mg/dL (ref 5–40)

## 2024-01-13 LAB — HEMOGLOBIN A1C
Est. average glucose Bld gHb Est-mCnc: 137 mg/dL
Hgb A1c MFr Bld: 6.4 % — ABNORMAL HIGH (ref 4.8–5.6)

## 2024-01-15 ENCOUNTER — Ambulatory Visit: Payer: Self-pay | Admitting: Family Medicine

## 2024-01-17 ENCOUNTER — Other Ambulatory Visit: Payer: Self-pay | Admitting: Nurse Practitioner

## 2024-01-25 ENCOUNTER — Other Ambulatory Visit (HOSPITAL_COMMUNITY): Payer: Self-pay | Admitting: Cardiology

## 2024-01-27 ENCOUNTER — Other Ambulatory Visit: Payer: Self-pay

## 2024-01-27 NOTE — Telephone Encounter (Signed)
 This is a CHF pt

## 2024-01-30 MED ORDER — TICAGRELOR 90 MG PO TABS: 90.0000 mg | ORAL_TABLET | Freq: Two times a day (BID) | ORAL | 3 refills | Status: AC

## 2024-02-13 ENCOUNTER — Telehealth: Payer: Self-pay | Admitting: Pharmacy Technician

## 2024-02-13 NOTE — Telephone Encounter (Signed)
 Received:   Tried for pa but it said:   Per other encounter pa good until 03/24/24

## 2024-02-24 DIAGNOSIS — H35033 Hypertensive retinopathy, bilateral: Secondary | ICD-10-CM | POA: Diagnosis not present

## 2024-02-24 DIAGNOSIS — H43823 Vitreomacular adhesion, bilateral: Secondary | ICD-10-CM | POA: Diagnosis not present

## 2024-02-24 DIAGNOSIS — E113411 Type 2 diabetes mellitus with severe nonproliferative diabetic retinopathy with macular edema, right eye: Secondary | ICD-10-CM | POA: Diagnosis not present

## 2024-02-24 DIAGNOSIS — E113412 Type 2 diabetes mellitus with severe nonproliferative diabetic retinopathy with macular edema, left eye: Secondary | ICD-10-CM | POA: Diagnosis not present

## 2024-02-24 DIAGNOSIS — E113413 Type 2 diabetes mellitus with severe nonproliferative diabetic retinopathy with macular edema, bilateral: Secondary | ICD-10-CM | POA: Diagnosis not present

## 2024-03-10 ENCOUNTER — Other Ambulatory Visit (HOSPITAL_COMMUNITY): Payer: Self-pay | Admitting: Cardiology

## 2024-03-12 ENCOUNTER — Other Ambulatory Visit (HOSPITAL_COMMUNITY): Payer: Self-pay

## 2024-03-12 ENCOUNTER — Telehealth: Payer: Self-pay | Admitting: Pharmacy Technician

## 2024-03-12 NOTE — Telephone Encounter (Signed)
 Pharmacy Patient Advocate Encounter   Received notification from Onbase that prior authorization for Repatha  sureclick 140mg /ml  is required/requested.   Insurance verification completed.   The patient is insured through [employer] [place]  .   Per test claim: PA required; PA submitted to above mentioned insurance via LATENT Key/confirmation #/EOC [id number] Status is pending

## 2024-03-22 ENCOUNTER — Other Ambulatory Visit (HOSPITAL_COMMUNITY): Payer: Self-pay

## 2024-03-22 NOTE — Telephone Encounter (Signed)
 Pharmacy Patient Advocate Encounter  Received notification from [employer] [place]  that Prior Authorization for Repatha  sureclick 140mg /ml  has been APPROVED from 02/11/2024 to 03/12/2025. Unable to obtain price due to refill too soon rejection, last fill date 03/05/2024 next available fill date08/14/2025.   PA #/Case ID/Reference #: [id number]

## 2024-03-24 ENCOUNTER — Ambulatory Visit (INDEPENDENT_AMBULATORY_CARE_PROVIDER_SITE_OTHER)

## 2024-03-24 ENCOUNTER — Ambulatory Visit
Admission: EM | Admit: 2024-03-24 | Discharge: 2024-03-24 | Disposition: A | Discharge status: Home / Self Care | Attending: Family Medicine | Admitting: Family Medicine

## 2024-03-24 DIAGNOSIS — S8264XA Nondisplaced fracture of lateral malleolus of right fibula, initial encounter for closed fracture: Secondary | ICD-10-CM | POA: Diagnosis not present

## 2024-03-24 DIAGNOSIS — S82401A Unspecified fracture of shaft of right fibula, initial encounter for closed fracture: Secondary | ICD-10-CM | POA: Diagnosis not present

## 2024-03-24 NOTE — Discharge Instructions (Signed)
 Wear the cam boot anytime you are walking around to keep the area immobilized and do not bear any weight on the right foot.  Use the crutches anytime you need to walk around.  Call the orthopedist first thing Monday morning to schedule a follow-up appointment for further recommendations.  Elevate at rest to help with swelling, over-the-counter pain relievers as needed.
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
## 2024-03-27 DIAGNOSIS — M25571 Pain in right ankle and joints of right foot: Secondary | ICD-10-CM | POA: Diagnosis not present

## 2024-03-30 DIAGNOSIS — H35033 Hypertensive retinopathy, bilateral: Secondary | ICD-10-CM | POA: Diagnosis not present

## 2024-03-30 DIAGNOSIS — H43823 Vitreomacular adhesion, bilateral: Secondary | ICD-10-CM | POA: Diagnosis not present

## 2024-03-30 DIAGNOSIS — E113411 Type 2 diabetes mellitus with severe nonproliferative diabetic retinopathy with macular edema, right eye: Secondary | ICD-10-CM | POA: Diagnosis not present

## 2024-03-30 DIAGNOSIS — E113413 Type 2 diabetes mellitus with severe nonproliferative diabetic retinopathy with macular edema, bilateral: Secondary | ICD-10-CM | POA: Diagnosis not present

## 2024-03-30 DIAGNOSIS — E113412 Type 2 diabetes mellitus with severe nonproliferative diabetic retinopathy with macular edema, left eye: Secondary | ICD-10-CM | POA: Diagnosis not present

## 2024-04-03 DIAGNOSIS — M79671 Pain in right foot: Secondary | ICD-10-CM | POA: Diagnosis not present

## 2024-04-06 DIAGNOSIS — M79671 Pain in right foot: Secondary | ICD-10-CM | POA: Diagnosis not present

## 2024-04-10 ENCOUNTER — Encounter (INDEPENDENT_AMBULATORY_CARE_PROVIDER_SITE_OTHER): Payer: Self-pay | Admitting: *Deleted

## 2024-04-13 ENCOUNTER — Ambulatory Visit: Admitting: Family Medicine

## 2024-04-13 VITALS — BP 120/80 | HR 70 | Temp 97.9°F | Ht 68.25 in

## 2024-04-13 DIAGNOSIS — E118 Type 2 diabetes mellitus with unspecified complications: Secondary | ICD-10-CM

## 2024-04-13 DIAGNOSIS — Z7985 Long-term (current) use of injectable non-insulin antidiabetic drugs: Secondary | ICD-10-CM

## 2024-04-13 DIAGNOSIS — Z7984 Long term (current) use of oral hypoglycemic drugs: Secondary | ICD-10-CM

## 2024-04-13 DIAGNOSIS — I25110 Atherosclerotic heart disease of native coronary artery with unstable angina pectoris: Secondary | ICD-10-CM

## 2024-04-13 DIAGNOSIS — I5022 Chronic systolic (congestive) heart failure: Secondary | ICD-10-CM | POA: Diagnosis not present

## 2024-04-13 DIAGNOSIS — E785 Hyperlipidemia, unspecified: Secondary | ICD-10-CM

## 2024-04-13 DIAGNOSIS — E1169 Type 2 diabetes mellitus with other specified complication: Secondary | ICD-10-CM

## 2024-04-13 MED ORDER — FARXIGA 10 MG PO TABS: 10.0000 mg | ORAL_TABLET | Freq: Every day | ORAL | 3 refills | Status: AC

## 2024-04-13 MED ORDER — SPIRONOLACTONE 25 MG PO TABS: 25.0000 mg | ORAL_TABLET | Freq: Every day | ORAL | 3 refills | Status: AC

## 2024-04-13 MED ORDER — MUPIROCIN 2 % EX OINT: 1.0000 | TOPICAL_OINTMENT | Freq: Two times a day (BID) | CUTANEOUS | 0 refills | Status: AC

## 2024-04-13 NOTE — Patient Instructions (Signed)
 Medications sent in.  Labs ordered.  Follow up in 6 months.

## 2024-04-14 LAB — BASIC METABOLIC PANEL WITH GFR
BUN/Creatinine Ratio: 10 (ref 10–24)
BUN: 10 mg/dL (ref 8–27)
CO2: 22 mmol/L (ref 20–29)
Calcium: 9.9 mg/dL (ref 8.6–10.2)
Chloride: 102 mmol/L (ref 96–106)
Creatinine, Ser: 0.96 mg/dL (ref 0.76–1.27)
Glucose: 112 mg/dL — ABNORMAL HIGH (ref 70–99)
Potassium: 4.5 mmol/L (ref 3.5–5.2)
Sodium: 139 mmol/L (ref 134–144)
eGFR: 89 mL/min/1.73 (ref >59)

## 2024-04-14 LAB — HEMOGLOBIN A1C
Est. average glucose Bld gHb Est-mCnc: 131 mg/dL
Hgb A1c MFr Bld: 6.2 % — ABNORMAL HIGH (ref 4.8–5.6)

## 2024-04-16 ENCOUNTER — Ambulatory Visit: Payer: Self-pay | Admitting: Family Medicine

## 2024-04-16 NOTE — Assessment & Plan Note (Signed)
 A1c is at goal. Continue Farxiga , Metformin , and Mounjaro .

## 2024-04-16 NOTE — Progress Notes (Signed)
 Subjective:  Patient ID: Daniel Bautista, male    DOB: Jan 20, 1961  Age: 63 y.o. MRN: [id number]  CC:   Chief Complaint  Patient presents with   3 month follow up     Refills needed  Recent ankle injury     HPI:  63 year old male presents for follow up.    Recent ankle fracture. Otherwise he is doing well. He has a small abrasion of the right lower extremity. Wife wants me to examine today.   Feeling better since Mounjaro  dose was decreased. Nees A1c. Needs refill on Farxiga .  LDL at goal on Lipitor and Repatha .  No chest pain or SOB. Follows closely with cardiology.    Patient Active Problem List   Diagnosis Date Noted   Chronic systolic heart failure (HCC) 12/09/2022   Hyperlipidemia associated with type 2 diabetes mellitus (HCC) 11/03/2022   Coronary artery disease involving native coronary artery of native heart with unstable angina pectoris (HCC) 11/03/2022   Presence of drug coated stent in right coronary artery 11/03/2022   Ischemic cardiomyopathy 11/03/2022   Type 2 diabetes mellitus with complication, without long-term current use of insulin  (HCC) 11/03/2022    Social Hx   Social History   Socioeconomic History   Marital status: Married    Spouse name: Jon   Number of children: 3   Years of education: Not on file   Highest education level: High school graduate  Occupational History   Occupation: Retired  Tobacco Use   Smoking status: Former    Current packs/day: 0.00    Types: Cigarettes    Start date: 11/01/1977    Quit date: 11/02/2022    Years since quitting: 1.4    Passive exposure: Never   Smokeless tobacco: Never  Vaping Use   Vaping status: Never Used  Substance and Sexual Activity   Alcohol use: Yes    Alcohol/week: 12.0 standard drinks of alcohol    Types: 12 Cans of beer per week    Comment: 12 pack per month   Drug use: Never   Sexual activity: Not on file  Other Topics Concern   Not on file  Social History Narrative   Not on  file   Social Drivers of Health   Financial Resource Strain: Low Risk  (11/04/2022)   Overall Financial Resource Strain (CARDIA)    Difficulty of Paying Living Expenses: Not hard at all  Food Insecurity: No Food Insecurity (11/04/2022)   Hunger Vital Sign    Worried About Running Out of Food in the Last Year: Never true    Ran Out of Food in the Last Year: Never true  Transportation Needs: No Transportation Needs (11/04/2022)   PRAPARE - Administrator, Civil Service (Medical): No    Lack of Transportation (Non-Medical): No  Physical Activity: Not on file  Stress: Not on file  Social Connections: Not on file    Review of Systems Per HPI  Objective:  BP 120/80   Pulse 70   Temp 97.9 F (36.6 C)   Ht 5' 8.25 (1.734 m)   SpO2 98%   BMI 24.90 kg/m      04/13/2024    9:16 AM 03/24/2024    9:39 AM 01/12/2024    8:59 AM  BP/Weight  Systolic BP 120 117 109  Diastolic BP 80 80 71  Wt. (Lbs) --  165  BMI   24.9 kg/m2    Physical Exam Vitals and nursing note reviewed.  Constitutional:      General: He is not in acute distress.    Appearance: Normal appearance.  HENT:     Head: Normocephalic and atraumatic.  Eyes:     General:        Right eye: No discharge.        Left eye: No discharge.     Conjunctiva/sclera: Conjunctivae normal.  Cardiovascular:     Rate and Rhythm: Normal rate and regular rhythm.  Pulmonary:     Effort: Pulmonary effort is normal.     Breath sounds: Normal breath sounds. No wheezing, rhonchi or rales.  Neurological:     Mental Status: He is alert.  Psychiatric:        Mood and Affect: Mood normal.        Behavior: Behavior normal.     Lab Results  Component Value Date   WBC 7.7 07/07/2023   HGB 15.0 07/07/2023   HCT 45.0 07/07/2023   PLT 294 07/07/2023   GLUCOSE 112 (H) 04/13/2024   CHOL 112 01/12/2024   TRIG 135 01/12/2024   HDL 32 (L) 01/12/2024   LDLCALC 56 01/12/2024   ALT 26 07/07/2023   AST 22 07/07/2023   NA 139  04/13/2024   K 4.5 04/13/2024   CL 102 04/13/2024   CREATININE 0.96 04/13/2024   BUN 10 04/13/2024   CO2 22 04/13/2024   TSH 1.042 07/07/2023   INR 1.0 11/02/2022   HGBA1C 6.2 (H) 04/13/2024     Assessment & Plan:  Type 2 diabetes mellitus with complication, without long-term current use of insulin  (HCC) Assessment & Plan: A1c is at goal. Continue Farxiga , Metformin , and Mounjaro .  Orders: -     Farxiga ; Take 1 tablet (10 mg total) by mouth daily before breakfast.  Dispense: 90 tablet; Refill: 3 -     Basic metabolic panel with GFR -     Hemoglobin A1c  Coronary artery disease involving native coronary artery of native heart with unstable angina pectoris (HCC) Assessment & Plan: Stable. Continue current medications.   Hyperlipidemia associated with type 2 diabetes mellitus (HCC) Assessment & Plan: Last LDL 56. Continue statin and Repatha .    Chronic systolic heart failure (HCC) -     Spironolactone ; Take 1 tablet (25 mg total) by mouth daily.  Dispense: 90 tablet; Refill: 3 -     Farxiga ; Take 1 tablet (10 mg total) by mouth daily before breakfast.  Dispense: 90 tablet; Refill: 3  Other orders -     Mupirocin ; Apply 1 Application topically 2 (two) times daily.  Dispense: 30 g; Refill: 0    Follow-up:  6 months.  Jacqulyn Ahle DO [facility]

## 2024-04-16 NOTE — Assessment & Plan Note (Signed)
 Stable.  Continue current medications.

## 2024-04-16 NOTE — Assessment & Plan Note (Addendum)
 Last LDL 56. Continue statin and Repatha .

## 2024-04-20 DIAGNOSIS — M25571 Pain in right ankle and joints of right foot: Secondary | ICD-10-CM | POA: Diagnosis not present

## 2024-04-23 ENCOUNTER — Other Ambulatory Visit: Payer: Self-pay | Admitting: Family Medicine

## 2024-05-11 ENCOUNTER — Other Ambulatory Visit (HOSPITAL_COMMUNITY): Payer: Self-pay

## 2024-05-11 DIAGNOSIS — E113413 Type 2 diabetes mellitus with severe nonproliferative diabetic retinopathy with macular edema, bilateral: Secondary | ICD-10-CM | POA: Diagnosis not present

## 2024-05-11 DIAGNOSIS — H43823 Vitreomacular adhesion, bilateral: Secondary | ICD-10-CM | POA: Diagnosis not present

## 2024-05-11 DIAGNOSIS — H2513 Age-related nuclear cataract, bilateral: Secondary | ICD-10-CM | POA: Diagnosis not present

## 2024-05-11 DIAGNOSIS — H35033 Hypertensive retinopathy, bilateral: Secondary | ICD-10-CM | POA: Diagnosis not present

## 2024-05-14 DIAGNOSIS — M25571 Pain in right ankle and joints of right foot: Secondary | ICD-10-CM | POA: Diagnosis not present

## 2024-06-04 DIAGNOSIS — M25571 Pain in right ankle and joints of right foot: Secondary | ICD-10-CM | POA: Diagnosis not present

## 2024-06-09 ENCOUNTER — Other Ambulatory Visit (HOSPITAL_COMMUNITY): Payer: Self-pay | Admitting: Cardiology

## 2024-06-18 ENCOUNTER — Other Ambulatory Visit: Payer: Self-pay | Admitting: Cardiology

## 2024-06-20 NOTE — Telephone Encounter (Signed)
 This refill request will need to be handle by patient's primary provider or endocrinologist  Please dey refill

## 2024-06-20 NOTE — Telephone Encounter (Signed)
 Pt of Dr. Anner. Does Dr. Anner want to refill this RX? Please advise.

## 2024-06-25 DIAGNOSIS — M25571 Pain in right ankle and joints of right foot: Secondary | ICD-10-CM | POA: Diagnosis not present

## 2024-07-06 DIAGNOSIS — E113413 Type 2 diabetes mellitus with severe nonproliferative diabetic retinopathy with macular edema, bilateral: Secondary | ICD-10-CM | POA: Diagnosis not present

## 2024-07-06 DIAGNOSIS — H43823 Vitreomacular adhesion, bilateral: Secondary | ICD-10-CM | POA: Diagnosis not present

## 2024-07-06 DIAGNOSIS — H35033 Hypertensive retinopathy, bilateral: Secondary | ICD-10-CM | POA: Diagnosis not present

## 2024-07-06 DIAGNOSIS — H2513 Age-related nuclear cataract, bilateral: Secondary | ICD-10-CM | POA: Diagnosis not present

## 2024-10-12 ENCOUNTER — Ambulatory Visit: Admitting: Family Medicine
